# Patient Record
Sex: Male | Born: 1960 | Race: Black or African American | Hispanic: No | Marital: Married | Smoking: Former smoker
Health system: Southern US, Community
[De-identification: ages and names within clinical notes are randomized; demographics above are authoritative.]

## PROBLEM LIST (undated history)

## (undated) DIAGNOSIS — H5440 Blindness, one eye, unspecified eye: Secondary | ICD-10-CM

## (undated) HISTORY — DX: Blindness, one eye, unspecified eye: H54.40

## (undated) HISTORY — PX: EYE SURGERY: SHX253

## (undated) HISTORY — PX: COLONOSCOPY: SHX174

---

## 1999-01-04 ENCOUNTER — Encounter: Admission: RE | Admit: 1999-01-04 | Discharge: 1999-01-18 | Payer: Self-pay | Admitting: Specialist

## 2012-02-24 ENCOUNTER — Encounter: Payer: Self-pay | Admitting: Internal Medicine

## 2012-03-23 ENCOUNTER — Telehealth: Payer: Self-pay | Admitting: *Deleted

## 2012-03-23 NOTE — Telephone Encounter (Signed)
Home number out of service.  Sent No show letter

## 2012-04-02 ENCOUNTER — Encounter: Payer: Self-pay | Admitting: Internal Medicine

## 2015-01-27 ENCOUNTER — Encounter: Payer: Self-pay | Admitting: Pediatrics

## 2015-01-27 ENCOUNTER — Ambulatory Visit (INDEPENDENT_AMBULATORY_CARE_PROVIDER_SITE_OTHER): Payer: BLUE CROSS/BLUE SHIELD | Admitting: Pediatrics

## 2015-01-27 VITALS — BP 117/70 | HR 86 | Temp 98.0°F | Ht 72.0 in | Wt 215.6 lb

## 2015-01-27 DIAGNOSIS — Z0000 Encounter for general adult medical examination without abnormal findings: Secondary | ICD-10-CM | POA: Diagnosis not present

## 2015-01-27 DIAGNOSIS — Z6829 Body mass index (BMI) 29.0-29.9, adult: Secondary | ICD-10-CM

## 2015-01-27 DIAGNOSIS — Z1211 Encounter for screening for malignant neoplasm of colon: Secondary | ICD-10-CM

## 2015-01-27 DIAGNOSIS — F17200 Nicotine dependence, unspecified, uncomplicated: Secondary | ICD-10-CM | POA: Diagnosis not present

## 2015-01-27 MED ORDER — VARENICLINE TARTRATE 0.5 MG X 11 & 1 MG X 42 PO MISC: ORAL | Status: DC

## 2015-01-27 NOTE — Progress Notes (Signed)
Subjective:    Patient ID: [name], male    DOB: 1961/01/10, 54 y.o.   MRN: [id number]  CC: CPE  HPI: [name] is a 54 y.o. male presenting on 01/27/2015 for New Patient (Initial Visit)  Overall feeling well, here for yearly physical for work. Works at [employer], does a lot of lifting throughout the day. No problems with joints.  Smoker 1ppd interested in quitting. Has tried decreasing cigarettes. Smokes at work. Has gone hours without cigarettes such as when driving long distances in his new car, or when with his mom. Lives with his GF, she does not smoke. He usually but not always smokes outside.  No CP with exertion, no SOB, normal stooling, normal appetite Has gained a few pounds in last few years, prior baseline was 195, now 215 lbs   ROS: All systems negative other than what is in HPI  Past Medical History There are no active problems to display for this patient.  Social History   Social History  . Marital Status: Single    Spouse Name: N/A  . Number of Children: N/A  . Years of Education: N/A   Occupational History  . Not on file.   Social History Main Topics  . Smoking status: Current Every Day Smoker -- 1.00 packs/day    Types: Cigarettes  . Smokeless tobacco: Not on file  . Alcohol Use: No  . Drug Use: No  . Sexual Activity: No   Other Topics Concern  . Not on file   Social History Narrative  . No narrative on file   Fam hx: sister with DM2 No fam hx of colon ca  Current Outpatient Prescriptions  Medication Sig Dispense Refill  . varenicline (CHANTIX PAK) 0.5 MG X 11 & 1 MG X 42 tablet Take one 0.5 mg tablet by mouth once daily for 3 days, then increase to one 0.5 mg tablet twice daily for 4 days, then increase to one 1 mg tablet twice daily. 53 tablet 0   No current facility-administered medications for this visit.       Objective:    BP 117/70 mmHg  Pulse 86  Temp(Src) 98 F (36.7 C) (Oral)  Ht 6' (1.829 m)  Wt 215 lb  9.6 oz (97.796 kg)  BMI 29.23 kg/m2  Wt Readings from Last 3 Encounters:  01/27/15 215 lb 9.6 oz (97.796 kg)    Gen: NAD, alert, cooperative with exam, NCAT EYES: EOMI, no scleral injection or icterus ENT:  TMs pearly gray b/l, OP without erythema LYMPH: no cervical LAD CV: NRRR, normal S1/S2, no murmur, distal pulses 2+ b/l Resp: CTABL, no wheezes, normal WOB Abd: +BS, soft, NTND. no guarding or organomegaly Ext: No edema, warm Neuro: Alert and oriented, strength equal b/l UE and LE, coordination grossly normal MSK: normal muscle bulk     Assessment & Plan:   Joangel was seen today for CPE.  Diagnoses and all orders for this visit:  Encounter for preventive health examination -     Ambulatory referral to Gastroenterology -     CMP14+EGFR -     Lipid panel  Tobacco use disorder Over 10 minutes spent in counseling on tobacco cessation strategies, including discussed identifying places and routines that he smokes during, coming up with different plan that is not smoking during those times. He is planning on using Jan 1 as his quit date, will start chantix 2 weeks before, let me know how he is doing, will send in refill.  Come back to see me 8 weeks after starting chantix. Will let me know if has any side effects from chantix.  -     varenicline (CHANTIX PAK) 0.5 MG X 11 & 1 MG X 42 tablet; Take one 0.5 mg tablet by mouth once daily for 3 days, then increase to one 0.5 mg tablet twice daily for 4 days, then increase to one 1 mg tablet twice daily.  Screening for colon cancer Due for colonoscopy, none prior. -     Ambulatory referral to Gastroenterology  BMI 29 Discussed lifestyle changes, increasing exercise daily with walking 20-30 min, avoiding fast food, not ordering french fries when he does get fast food, try not to eat out.  Follow up plan: Return in about 8 weeks (around 03/24/2015).  Assunta Found, MD [facility] 01/27/2015, 3:36 PM

## 2015-01-28 LAB — CMP14+EGFR
A/G RATIO: 2.2 (ref 1.1–2.5)
ALBUMIN: 4.7 g/dL (ref 3.5–5.5)
ALT: 25 IU/L (ref 0–44)
AST: 20 IU/L (ref 0–40)
Alkaline Phosphatase: 74 IU/L (ref 39–117)
BILIRUBIN TOTAL: 0.3 mg/dL (ref 0.0–1.2)
BUN / CREAT RATIO: 11 (ref 9–20)
BUN: 12 mg/dL (ref 6–24)
CALCIUM: 10.1 mg/dL (ref 8.7–10.2)
CHLORIDE: 104 mmol/L (ref 97–106)
CO2: 24 mmol/L (ref 18–29)
Creatinine, Ser: 1.08 mg/dL (ref 0.76–1.27)
GFR, EST AFRICAN AMERICAN: 89 mL/min/{1.73_m2} (ref >59)
GFR, EST NON AFRICAN AMERICAN: 77 mL/min/{1.73_m2} (ref >59)
Globulin, Total: 2.1 g/dL (ref 1.5–4.5)
Glucose: 85 mg/dL (ref 65–99)
POTASSIUM: 4.9 mmol/L (ref 3.5–5.2)
Sodium: 143 mmol/L (ref 136–144)
TOTAL PROTEIN: 6.8 g/dL (ref 6.0–8.5)

## 2015-01-28 LAB — LIPID PANEL
CHOL/HDL RATIO: 4.3 ratio (ref 0.0–5.0)
Cholesterol, Total: 213 mg/dL — ABNORMAL HIGH (ref 100–199)
HDL: 49 mg/dL (ref >39)
LDL Calculated: 144 mg/dL — ABNORMAL HIGH (ref 0–99)
Triglycerides: 102 mg/dL (ref 0–149)
VLDL CHOLESTEROL CAL: 20 mg/dL (ref 5–40)

## 2015-11-25 ENCOUNTER — Emergency Department (HOSPITAL_COMMUNITY): Payer: BLUE CROSS/BLUE SHIELD

## 2015-11-25 ENCOUNTER — Emergency Department (HOSPITAL_COMMUNITY)
Admission: EM | Admit: 2015-11-25 | Discharge: 2015-11-25 | Disposition: A | Discharge status: Home / Self Care | Payer: BLUE CROSS/BLUE SHIELD | Attending: Emergency Medicine | Admitting: Emergency Medicine

## 2015-11-25 ENCOUNTER — Encounter (HOSPITAL_COMMUNITY): Payer: Self-pay | Admitting: Emergency Medicine

## 2015-11-25 DIAGNOSIS — Z79899 Other long term (current) drug therapy: Secondary | ICD-10-CM | POA: Insufficient documentation

## 2015-11-25 DIAGNOSIS — R0981 Nasal congestion: Secondary | ICD-10-CM | POA: Diagnosis not present

## 2015-11-25 DIAGNOSIS — R42 Dizziness and giddiness: Secondary | ICD-10-CM | POA: Diagnosis not present

## 2015-11-25 DIAGNOSIS — F17210 Nicotine dependence, cigarettes, uncomplicated: Secondary | ICD-10-CM | POA: Insufficient documentation

## 2015-11-25 LAB — CBC WITH DIFFERENTIAL/PLATELET
Basophils Absolute: 0 10*3/uL (ref 0.0–0.1)
Basophils Relative: 0 %
EOS ABS: 0.1 10*3/uL (ref 0.0–0.7)
Eosinophils Relative: 2 %
HEMATOCRIT: 44.1 % (ref 39.0–52.0)
HEMOGLOBIN: 15.2 g/dL (ref 13.0–17.0)
LYMPHS ABS: 2.4 10*3/uL (ref 0.7–4.0)
LYMPHS PCT: 42 %
MCH: 29.9 pg (ref 26.0–34.0)
MCHC: 34.5 g/dL (ref 30.0–36.0)
MCV: 86.8 fL (ref 78.0–100.0)
Monocytes Absolute: 0.5 10*3/uL (ref 0.1–1.0)
Monocytes Relative: 10 %
NEUTROS ABS: 2.6 10*3/uL (ref 1.7–7.7)
NEUTROS PCT: 46 %
Platelets: 245 10*3/uL (ref 150–400)
RBC: 5.08 MIL/uL (ref 4.22–5.81)
RDW: 13.3 % (ref 11.5–15.5)
WBC: 5.7 10*3/uL (ref 4.0–10.5)

## 2015-11-25 LAB — BASIC METABOLIC PANEL
ANION GAP: 9 (ref 5–15)
BUN: 16 mg/dL (ref 6–20)
CHLORIDE: 104 mmol/L (ref 101–111)
CO2: 26 mmol/L (ref 22–32)
CREATININE: 1.1 mg/dL (ref 0.61–1.24)
Calcium: 9.7 mg/dL (ref 8.9–10.3)
GFR calc non Af Amer: >60 mL/min (ref >60)
Glucose, Bld: 90 mg/dL (ref 65–99)
POTASSIUM: 4.3 mmol/L (ref 3.5–5.1)
SODIUM: 139 mmol/L (ref 135–145)

## 2015-11-25 MED ORDER — MECLIZINE HCL 12.5 MG PO TABS
25.0000 mg | ORAL_TABLET | Freq: Once | ORAL | Status: AC
  Administered 2015-11-25: 25 mg via ORAL
  Filled 2015-11-25: qty 2

## 2015-11-25 MED ORDER — SODIUM CHLORIDE 0.9 % IV BOLUS (SEPSIS)
1000.0000 mL | Freq: Once | INTRAVENOUS | Status: AC
  Administered 2015-11-25: 1000 mL via INTRAVENOUS

## 2015-11-25 MED ORDER — DIAZEPAM 5 MG/ML IJ SOLN
5.0000 mg | Freq: Once | INTRAMUSCULAR | Status: AC
  Administered 2015-11-25: 5 mg via INTRAVENOUS
  Filled 2015-11-25: qty 2

## 2015-11-25 MED ORDER — MECLIZINE HCL 25 MG PO TABS: 25.0000 mg | ORAL_TABLET | Freq: Three times a day (TID) | ORAL | 0 refills | Status: DC | PRN

## 2015-11-25 NOTE — Discharge Instructions (Signed)
Meclizine as prescribed as needed for dizziness. ° °Return to the emergency department if your symptoms significantly worsen or change. °

## 2015-11-25 NOTE — ED Provider Notes (Addendum)
[facility]-EMERGENCY DEPT Provider Note   CSN: [id number] Arrival date & time: 11/25/15  1055  By signing my name below, I, Rosario AdieWilliam Andrew Hiatt, attest that this documentation has been prepared under the direction and in the presence of . Electronically Signed: Rosario AdieWilliam Andrew Hiatt, ED Scribe. 11/25/15. 12:33 PM.  History   Chief Complaint Chief Complaint  Patient presents with  . Dizziness   The history is provided by the patient. No language interpreter was used.  Dizziness  Quality:  Vertigo and imbalance Severity:  Moderate Timing:  Intermittent Progression:  Waxing and waning Chronicity:  New Context: head movement and standing up   Relieved by:  Being still Worsened by:  Movement Ineffective treatments:  None tried Associated symptoms: no headaches, no hearing loss, no nausea, no palpitations, no vision changes, no vomiting and no weakness   Risk factors: no anemia, no heart disease, no hx of stroke, no hx of vertigo, no Meniere's disease, no multiple medications and no new medications    HPI Comments: [name] is a 55 y.o. male with no pertinent PMHx, who presents to the Emergency Department complaining of intermittent episodes of dizziness onset this AM.  Pt reports that he woke up this morning with his dizziness. He states that he additionally feels off balance secondary to his dizziness and a sensation of fullness to his left ear. No hx of similar symptoms. Pt notes that he has been sick recently with mild congestion, but no illnesses otherwise. His dizziness is mildly relieved with sitting still, and exacerbated with movement and positional changes. Denies weakness, numbness, HA, hearing loss, visual disturbance, nausea, emesis, sensation of palpitations, or any other associated symptoms.  History reviewed. No pertinent past medical history.  Patient Active Problem List   Diagnosis Date Noted  . Tobacco use disorder 01/27/2015  . BMI 29.0-29.9,adult 01/27/2015    Past Surgical History:  Procedure Laterality Date  . EYE SURGERY      Home Medications    Prior to Admission medications   Medication Sig Start Date End Date Taking? Authorizing Provider  varenicline (CHANTIX PAK) 0.5 MG X 11 & 1 MG X 42 tablet Take one 0.5 mg tablet by mouth once daily for 3 days, then increase to one 0.5 mg tablet twice daily for 4 days, then increase to one 1 mg tablet twice daily. 01/27/15   Johna Sheriffarol L Vincent, MD    Family History Family History  Problem Relation Age of Onset  . Diabetes Sister     Social History Social History  Substance Use Topics  . Smoking status: Current Every Day Smoker    Packs/day: 1.00    Types: Cigarettes  . Smokeless tobacco: Never Used  . Alcohol use No     Comment: occassional   Allergies   Review of patient's allergies indicates no known allergies.  Review of Systems Review of Systems  HENT: Positive for congestion. Negative for hearing loss.   Cardiovascular: Negative for palpitations.  Gastrointestinal: Negative for nausea and vomiting.  Neurological: Positive for dizziness. Negative for weakness and headaches.  All other systems reviewed and are negative.  Physical Exam Updated Vital Signs BP 140/81 (BP Location: Left Arm)   Pulse 66   Temp 97.6 F (36.4 C) (Oral)   Resp 18   Ht 6' (1.829 m)   Wt 224 lb (101.6 kg)   SpO2 100%   BMI 30.38 kg/m   Physical Exam  Constitutional: He is oriented to person, place, and time. He appears well-developed  and well-nourished.  HENT:  Head: Normocephalic and atraumatic.  Eyes: EOM are normal. Pupils are equal, round, and reactive to light.  Neck: Normal range of motion.  Cardiovascular: Normal rate, regular rhythm, normal heart sounds and intact distal pulses.   Pulmonary/Chest: Effort normal and breath sounds normal. No respiratory distress.  Abdominal: Soft. He exhibits no distension. There is no tenderness.  Musculoskeletal: Normal range of motion.   Neurological: He is alert and oriented to person, place, and time. No cranial nerve deficit. He exhibits normal muscle tone. Coordination normal.  Skin: Skin is warm and dry.  Psychiatric: He has a normal mood and affect. Judgment normal.  Nursing note and vitals reviewed.  ED Treatments / Results  DIAGNOSTIC STUDIES: Oxygen Saturation is 100% on RA, normal by my interpretation.   COORDINATION OF CARE: 12:03 PM-Discussed next steps with pt. Pt verbalized understanding and is agreeable with the plan.   Labs (all labs ordered are listed, but only abnormal results are displayed) Labs Reviewed - No data to display  EKG  EKG Interpretation  Date/Time:  Saturday November 25 2015 12:20:19 EDT Ventricular Rate:  63 PR Interval:    QRS Duration: 95 QT Interval:  423 QTC Calculation: 433 R Axis:   70 Text Interpretation:  Sinus rhythm Baseline wander in lead(s) V6 Confirmed by Sarajean Dessert  MD, Harlow Basley ([id number]) on 11/25/2015 12:34:08 PM       Radiology No results found.  Procedures Procedures (including critical care time)  Medications Ordered in ED Medications - No data to display   Initial Impression / Assessment and Plan / ED Course  I have reviewed the triage vital signs and the nursing notes.  Pertinent labs & imaging results that were available during my care of the patient were reviewed by me and considered in my medical decision making (see chart for details).  Clinical Course    Patient presents with complaints of dizziness that he describes as a spinning sensation and is worse with movement and turning head. His neurologic exam is nonfocal. I highly suspect a peripheral vertigo. He was given meclizine and appears to be feeling better. I see no indication for imaging and believe he is appropriate for discharge. Laboratory studies and EKG are normal.  Patient was to be discharged, however became acutely dizzy once again when ambulating to the bathroom. He expressed some  concern about going home. He was then given IV fluids, Valium, and underwent head CT which was negative.  His symptoms and workup continue to be consistent with a peripheral vertigo and he will be treated as such.  Final Clinical Impressions(s) / ED Diagnoses   Final diagnoses:  None    New Prescriptions New Prescriptions   No medications on file   I personally performed the services described in this documentation, which was scribed in my presence. The recorded information has been reviewed and is accurate.       Geoffery Lyons, MD 11/25/15 1334    Geoffery Lyons, MD 11/25/15 [phone]

## 2015-11-25 NOTE — ED Triage Notes (Signed)
PT stated he went to bed around 1030 pm last night feeling fine and woke up at 0530 this am and upon standing feels dizzy but denies dizziness while sitting. PT also states his ears feel stopped up. PT denies any weakness.

## 2016-02-09 ENCOUNTER — Encounter: Payer: Self-pay | Admitting: Pediatrics

## 2016-02-09 ENCOUNTER — Ambulatory Visit (INDEPENDENT_AMBULATORY_CARE_PROVIDER_SITE_OTHER): Payer: BLUE CROSS/BLUE SHIELD | Admitting: Pediatrics

## 2016-02-09 VITALS — BP 125/81 | HR 87 | Temp 97.0°F | Ht 72.0 in | Wt 224.6 lb

## 2016-02-09 DIAGNOSIS — Z720 Tobacco use: Secondary | ICD-10-CM

## 2016-02-09 DIAGNOSIS — Z6830 Body mass index (BMI) 30.0-30.9, adult: Secondary | ICD-10-CM

## 2016-02-09 DIAGNOSIS — Z0000 Encounter for general adult medical examination without abnormal findings: Secondary | ICD-10-CM | POA: Diagnosis not present

## 2016-02-09 NOTE — Progress Notes (Signed)
  Subjective:   Patient ID: [name], male    DOB: [birth year], 55 y.o.   MRN: [id number] CC: Annual Exam  HPI: [name] is a 55 y.o. male presenting for Annual Exam  Feeling well Here for preventive exam for work No complaints  [place] colonoscopy: within last couple of years  No recent fevers, weight changes  Daily smoker Interested in quitting Down to 7-8 cig a day now Working on decreasing  Trying to stay active, walks a couple times a week  Fam hx: no MI  Relevant past medical, surgical, family and social history reviewed. Allergies and medications reviewed and updated. History  Smoking Status  . Current Every Day Smoker  . Packs/day: 1.00  . Types: Cigarettes  Smokeless Tobacco  . Never Used   ROS: Per HPI   Objective:    BP 125/81   Pulse 87   Temp 97 F (36.1 C) (Oral)   Ht 6' (1.829 m)   Wt 224 lb 9.6 oz (101.9 kg)   BMI 30.46 kg/m   Wt Readings from Last 3 Encounters:  02/09/16 224 lb 9.6 oz (101.9 kg)  11/25/15 224 lb (101.6 kg)  01/27/15 215 lb 9.6 oz (97.8 kg)    Gen: NAD, alert, cooperative with exam, NCAT EYES: EOMI, no conjunctival injection, or no icterus ENT:  TMs pearly gray b/l, OP without erythema LYMPH: no cervical LAD CV: NRRR, normal S1/S2, no murmur, distal pulses 2+ b/l Resp: CTABL, no wheezes, normal WOB Abd: +BS, soft, NTND. no guarding or organomegaly Ext: No edema, warm Neuro: Alert and oriented, strength equal b/l UE and LE, coordination grossly normal MSK: normal muscle bulk  Assessment & Plan:  [name] was seen today for annual exam.  Diagnoses and all orders for this visit:  Encounter for preventive health examination Colonoscopy: UTD, done at [facility] -     Lipid panel  BMI 30.0-30.9,adult Cont lifestyle changes, increase activity  Tobacco use Cont to decrease cig/day Down to 7-8 Discussed smoking cessation strategies  Follow up plan: Return in about 1 year  (around 02/08/2017). Assunta Found, MD [place]

## 2016-02-10 LAB — CMP14+EGFR
ALK PHOS: 79 IU/L (ref 39–117)
ALT: 24 IU/L (ref 0–44)
AST: 18 IU/L (ref 0–40)
Albumin/Globulin Ratio: 2.2 (ref 1.2–2.2)
Albumin: 4.6 g/dL (ref 3.5–5.5)
BUN/Creatinine Ratio: 12 (ref 9–20)
BUN: 15 mg/dL (ref 6–24)
Bilirubin Total: 0.3 mg/dL (ref 0.0–1.2)
CALCIUM: 9.7 mg/dL (ref 8.7–10.2)
CO2: 23 mmol/L (ref 18–29)
CREATININE: 1.26 mg/dL (ref 0.76–1.27)
Chloride: 103 mmol/L (ref 96–106)
GFR calc Af Amer: 74 mL/min/{1.73_m2} (ref >59)
GFR, EST NON AFRICAN AMERICAN: 64 mL/min/{1.73_m2} (ref >59)
GLUCOSE: 89 mg/dL (ref 65–99)
Globulin, Total: 2.1 g/dL (ref 1.5–4.5)
Potassium: 4.9 mmol/L (ref 3.5–5.2)
Sodium: 141 mmol/L (ref 134–144)
Total Protein: 6.7 g/dL (ref 6.0–8.5)

## 2016-02-10 LAB — LIPID PANEL
CHOLESTEROL TOTAL: 224 mg/dL — AB (ref 100–199)
Chol/HDL Ratio: 5.2 ratio units — ABNORMAL HIGH (ref 0.0–5.0)
HDL: 43 mg/dL (ref >39)
LDL CALC: 149 mg/dL — AB (ref 0–99)
TRIGLYCERIDES: 160 mg/dL — AB (ref 0–149)
VLDL CHOLESTEROL CAL: 32 mg/dL (ref 5–40)

## 2016-05-08 ENCOUNTER — Encounter: Payer: Self-pay | Admitting: Family Medicine

## 2016-05-08 ENCOUNTER — Ambulatory Visit (INDEPENDENT_AMBULATORY_CARE_PROVIDER_SITE_OTHER): Payer: BLUE CROSS/BLUE SHIELD

## 2016-05-08 ENCOUNTER — Ambulatory Visit (INDEPENDENT_AMBULATORY_CARE_PROVIDER_SITE_OTHER): Payer: BLUE CROSS/BLUE SHIELD | Admitting: Family Medicine

## 2016-05-08 VITALS — BP 128/87 | HR 84 | Temp 97.2°F | Ht 73.83 in | Wt 224.0 lb

## 2016-05-08 DIAGNOSIS — M545 Low back pain, unspecified: Secondary | ICD-10-CM

## 2016-05-08 MED ORDER — TRAMADOL HCL 50 MG PO TABS: 50.0000 mg | ORAL_TABLET | Freq: Three times a day (TID) | ORAL | 0 refills | Status: DC | PRN

## 2016-05-08 MED ORDER — PREDNISONE 20 MG PO TABS: ORAL_TABLET | ORAL | 0 refills | Status: DC

## 2016-05-08 MED ORDER — CYCLOBENZAPRINE HCL 10 MG PO TABS: 10.0000 mg | ORAL_TABLET | Freq: Three times a day (TID) | ORAL | 0 refills | Status: DC | PRN

## 2016-05-08 NOTE — Patient Instructions (Signed)
Great to meet you!  Take prednisone daily, use flexeril and tramadol only as needed.   If this becomes recurrent then we can get physical therapy to help reduce the recurrence.   We will call within 1 week with x ray results

## 2016-05-08 NOTE — Progress Notes (Signed)
   HPI  Patient presents today here with back pain.  Patient explains that this weekend, Saturday, he was bending over to pick up something in his car when he had acute sudden midline low back pain. He states that he continues to hurt since that time described as midline dull low back pain with no radiation down the leg. It feels better after walking a few steps that hurts with sitting down for prolonged time. It's not causing any difficulty with sleep.  Patient states this has not happened in 5 or more years. He denies leg pain. He denies any difficulty walking  PMH: Smoking status noted ROS: Per HPI  Objective: BP 128/87   Pulse 84   Temp 97.2 F (36.2 C) (Oral)   Ht 6' 1.83" (1.875 m)   Wt 224 lb (101.6 kg)   BMI 28.89 kg/m  Gen: NAD, alert, cooperative with exam HEENT: NCAT, EOMI, PERRL CV: RRR, good S1/S2, no murmur Resp: CTABL, no wheezes, non-labored Neuro: Alert and oriented, strength 5/5 and sensation intact in bilateral lower extremities MSK Tenderness to palpation of the lower lumbar spine and midline area, also mild tenderness to palpation of the paraspinal muscles bilaterally  Assessment and plan:  # Acute midline low back pain without sciatica Treat with prednisone plus when necessary Flexeril and tramadol Plain film with midline tenderness If recurrent would recommend physical therapy. Return to clinic with any concerns or worsening symptoms.     Orders Placed This Encounter  Procedures  . DG Lumbar Spine 2-3 Views    Standing Status:   Future    Number of Occurrences:   1    Standing Expiration Date:   07/08/2017    Order Specific Question:   Reason for Exam (SYMPTOM  OR DIAGNOSIS REQUIRED)    Answer:   low back pain    Order Specific Question:   Preferred imaging location?    Answer:   Internal    Meds ordered this encounter  Medications  . predniSONE (DELTASONE) 20 MG tablet    Sig: 2 po at same time daily for 5 days    Dispense:  10 tablet    Refill:  0  . cyclobenzaprine (FLEXERIL) 10 MG tablet    Sig: Take 1 tablet (10 mg total) by mouth 3 (three) times daily as needed for muscle spasms.    Dispense:  30 tablet    Refill:  0  . traMADol (ULTRAM) 50 MG tablet    Sig: Take 1 tablet (50 mg total) by mouth every 8 (eight) hours as needed.    Dispense:  10 tablet    Refill:  0    Murtis SinkSam Bradshaw, MD [facility] 05/08/2016, 2:18 PM

## 2016-06-25 ENCOUNTER — Encounter: Payer: BLUE CROSS/BLUE SHIELD | Admitting: Family

## 2016-08-24 ENCOUNTER — Ambulatory Visit (INDEPENDENT_AMBULATORY_CARE_PROVIDER_SITE_OTHER): Payer: BLUE CROSS/BLUE SHIELD | Admitting: Family Medicine

## 2016-08-24 ENCOUNTER — Encounter: Payer: Self-pay | Admitting: Family Medicine

## 2016-08-24 VITALS — BP 117/71 | HR 75 | Temp 96.9°F | Ht 73.83 in | Wt 225.0 lb

## 2016-08-24 DIAGNOSIS — R0602 Shortness of breath: Secondary | ICD-10-CM | POA: Diagnosis not present

## 2016-08-24 DIAGNOSIS — J40 Bronchitis, not specified as acute or chronic: Secondary | ICD-10-CM

## 2016-08-24 DIAGNOSIS — J329 Chronic sinusitis, unspecified: Secondary | ICD-10-CM

## 2016-08-24 MED ORDER — BETAMETHASONE SOD PHOS & ACET 6 (3-3) MG/ML IJ SUSP
6.0000 mg | Freq: Once | INTRAMUSCULAR | Status: AC
  Administered 2016-08-24: 6 mg via INTRAMUSCULAR

## 2016-08-24 MED ORDER — LEVOFLOXACIN 500 MG PO TABS: 500.0000 mg | ORAL_TABLET | Freq: Every day | ORAL | 0 refills | Status: DC

## 2016-08-24 MED ORDER — HYDROCODONE-HOMATROPINE 5-1.5 MG/5ML PO SYRP: 5.0000 mL | ORAL_SOLUTION | Freq: Four times a day (QID) | ORAL | 0 refills | Status: DC | PRN

## 2016-08-24 NOTE — Progress Notes (Signed)
Chief Complaint  Patient presents with  . Shortness of Breath  . Cough  . Nasal Congestion    yellow  . mid back pain    HPI  Patient presents today for 2 days of increasing cough and shortness of breath. He just feels like his "four-barrel carburator" won't kick in. He usually works at [employer]. After 12 hours yesterday where he went home and mode until dark. Overnight symptoms continued to get worse. PMH: Smoking status noted ROS: Per HPI  Objective: BP 117/71 (BP Location: Left Arm)   Pulse 75   Temp (!) 96.9 F (36.1 C) (Oral)   Ht 6' 1.83" (1.875 m)   Wt 225 lb (102.1 kg)   BMI 29.02 kg/m  Gen: NAD, alert, cooperative with exam HEENT: NCAT, EOMI, PERRL CV: RRR, good S1/S2, no murmur Resp: CTABL, no wheezes, non-labored Abd: SNTND, BS present, no guarding or organomegaly Ext: No edema, warm Neuro: Alert and oriented, No gross deficits  Assessment and plan:  1. Sinobronchitis   2. Shortness of breath     Meds ordered this encounter  Medications  . HYDROcodone-homatropine (HYCODAN) 5-1.5 MG/5ML syrup    Sig: Take 5 mLs by mouth every 6 (six) hours as needed for cough.    Dispense:  120 mL    Refill:  0  . levofloxacin (LEVAQUIN) 500 MG tablet    Sig: Take 1 tablet (500 mg total) by mouth daily. For 10 days    Dispense:  10 tablet    Refill:  0  . betamethasone acetate-betamethasone sodium phosphate (CELESTONE) injection 6 mg    Orders Placed This Encounter  Procedures  . CBC with Differential/Platelet  . CMP14+EGFR    Order Specific Question:   Has the patient fasted?    Answer:   Yes    Follow up as needed.  Claretta Fraise, MD

## 2016-08-25 LAB — CBC WITH DIFFERENTIAL/PLATELET
Basophils Absolute: 0 10*3/uL (ref 0.0–0.2)
Basos: 0 %
EOS (ABSOLUTE): 0.1 10*3/uL (ref 0.0–0.4)
EOS: 1 %
HEMATOCRIT: 45.1 % (ref 37.5–51.0)
HEMOGLOBIN: 15.6 g/dL (ref 13.0–17.7)
IMMATURE GRANS (ABS): 0 10*3/uL (ref 0.0–0.1)
IMMATURE GRANULOCYTES: 0 %
LYMPHS: 49 %
Lymphocytes Absolute: 2.5 10*3/uL (ref 0.7–3.1)
MCH: 30 pg (ref 26.6–33.0)
MCHC: 34.6 g/dL (ref 31.5–35.7)
MCV: 87 fL (ref 79–97)
MONOCYTES: 7 %
MONOS ABS: 0.4 10*3/uL (ref 0.1–0.9)
NEUTROS PCT: 43 %
Neutrophils Absolute: 2.3 10*3/uL (ref 1.4–7.0)
Platelets: 272 10*3/uL (ref 150–379)
RBC: 5.2 x10E6/uL (ref 4.14–5.80)
RDW: 14.5 % (ref 12.3–15.4)
WBC: 5.2 10*3/uL (ref 3.4–10.8)

## 2016-08-25 LAB — CMP14+EGFR
ALT: 27 IU/L (ref 0–44)
AST: 23 IU/L (ref 0–40)
Albumin/Globulin Ratio: 2 (ref 1.2–2.2)
Albumin: 4.7 g/dL (ref 3.5–5.5)
Alkaline Phosphatase: 80 IU/L (ref 39–117)
BUN/Creatinine Ratio: 9 (ref 9–20)
BUN: 11 mg/dL (ref 6–24)
Bilirubin Total: 0.3 mg/dL (ref 0.0–1.2)
CALCIUM: 9.8 mg/dL (ref 8.7–10.2)
CO2: 22 mmol/L (ref 20–29)
CREATININE: 1.24 mg/dL (ref 0.76–1.27)
Chloride: 105 mmol/L (ref 96–106)
GFR calc Af Amer: 75 mL/min/{1.73_m2} (ref >59)
GFR, EST NON AFRICAN AMERICAN: 65 mL/min/{1.73_m2} (ref >59)
GLOBULIN, TOTAL: 2.3 g/dL (ref 1.5–4.5)
Glucose: 88 mg/dL (ref 65–99)
Potassium: 4.5 mmol/L (ref 3.5–5.2)
SODIUM: 142 mmol/L (ref 134–144)
Total Protein: 7 g/dL (ref 6.0–8.5)

## 2016-12-19 ENCOUNTER — Ambulatory Visit (INDEPENDENT_AMBULATORY_CARE_PROVIDER_SITE_OTHER): Payer: BLUE CROSS/BLUE SHIELD | Admitting: Nurse Practitioner

## 2016-12-19 ENCOUNTER — Encounter: Payer: Self-pay | Admitting: Nurse Practitioner

## 2016-12-19 VITALS — BP 111/80 | HR 80 | Temp 97.3°F | Ht 73.0 in | Wt 224.0 lb

## 2016-12-19 DIAGNOSIS — R591 Generalized enlarged lymph nodes: Secondary | ICD-10-CM

## 2016-12-19 NOTE — Progress Notes (Signed)
   Subjective:    Patient ID: [name], male    DOB: May 18, 1960, 56 y.o.   MRN: [id number]  HPI Patient comes in today c/o "large vein" on chest wall. He says he just noticd it. Denies any pain.    Review of Systems  Constitutional: Negative.   Respiratory: Negative.   Cardiovascular: Negative.   Neurological: Negative.   Psychiatric/Behavioral: Negative.   All other systems reviewed and are negative.      Objective:   Physical Exam  Constitutional: He appears well-developed and well-nourished. No distress.  Cardiovascular: Normal rate and regular rhythm.   Pulmonary/Chest: Effort normal and breath sounds normal.  Tender lymphnode inner left mammory fold.  Neurological: He is alert.  Skin: Skin is warm.  Psychiatric: He has a normal mood and affect. His behavior is normal. Judgment and thought content normal.   BP 111/80   Pulse 80   Temp (!) 97.3 F (36.3 C) (Oral)   Ht  (1.854 m)   Wt 224 lb (101.6 kg)   BMI 29.55 kg/m       Assessment & Plan:   1. Lymphadenopathy    Probably reactive Watch, if not gone by next wednesday will do ultra sound- patient will call  Mary-Margaret Daphine Deutscher, FNP

## 2017-07-07 ENCOUNTER — Encounter: Payer: Self-pay | Admitting: Pediatrics

## 2017-07-07 ENCOUNTER — Ambulatory Visit (INDEPENDENT_AMBULATORY_CARE_PROVIDER_SITE_OTHER): Payer: BLUE CROSS/BLUE SHIELD | Admitting: Pediatrics

## 2017-07-07 VITALS — BP 119/77 | HR 94 | Temp 98.5°F | Ht 73.0 in | Wt 222.0 lb

## 2017-07-07 DIAGNOSIS — Z6829 Body mass index (BMI) 29.0-29.9, adult: Secondary | ICD-10-CM | POA: Diagnosis not present

## 2017-07-07 DIAGNOSIS — Z0000 Encounter for general adult medical examination without abnormal findings: Secondary | ICD-10-CM | POA: Diagnosis not present

## 2017-07-07 NOTE — Progress Notes (Signed)
  Subjective:   Patient ID: [name], male    DOB: 1960/07/14, 57 y.o.   MRN: [id number] CC: Annual exam HPI: [name] is a 57 y.o. male   Feeling well overall.  Active at work, working for [employer].  Stays active outside of work as well.  No shortness of breath or chest pain with exertion.  Eating regular meals.  Appetite is been good.  Weight has been stable.  He thinks colonoscopy was 3 years ago at [facility].  We will request records again.  No lower urinary tract symptoms.  Rarely snores.  No daytime fatigue, no morning headache.  Relevant past medical, surgical, family and social history reviewed. Allergies and medications reviewed and updated.  Smoking: Quit several months ago.  Says he still rarely has a cigarette but not every week.  ROS: Per HPI   Objective:    BP 119/77   Pulse 94   Temp 98.5 F (36.9 C) (Oral)   Ht '6\' 1"'$  (1.854 m)   Wt 222 lb (100.7 kg)   BMI 29.29 kg/m   Wt Readings from Last 3 Encounters:  07/07/17 222 lb (100.7 kg)  12/19/16 224 lb (101.6 kg)  08/24/16 225 lb (102.1 kg)    Gen: NAD, alert, cooperative with exam, NCAT EYES: EOMI, no conjunctival injection, or no icterus ENT:  TMs pearly gray b/l, OP without erythema LYMPH: no cervical LAD CV: NRRR, normal S1/S2, no murmur, distal pulses 2+ b/l Resp: CTABL, no wheezes, normal WOB Abd: +BS, soft, NTND. no guarding or organomegaly Ext: No edema, warm Neuro: Alert and oriented, strength equal b/l UE and LE, coordination grossly normal MSK: normal muscle bulk  Assessment & Plan:  57 year old male here for annual well visit  Diagnoses and all orders for this visit:  Encounter for preventive care Will return for fasting blood work -     Lipid panel -     PSA, total and free -     BMP8+EGFR  BMI 29.0-29.9,adult Continue lifestyle modifications, increase fruit and vegetable intake, continue 20 to 30 minutes daily of aerobic exercise  Follow up  plan: Return in about 1 year (around 07/08/2018). Assunta Found, MD [place]

## 2017-07-08 ENCOUNTER — Encounter: Payer: Self-pay | Admitting: Pediatrics

## 2017-08-28 ENCOUNTER — Ambulatory Visit: Payer: BLUE CROSS/BLUE SHIELD | Admitting: Family Medicine

## 2018-03-13 ENCOUNTER — Telehealth: Payer: Self-pay | Admitting: Pediatrics

## 2018-03-13 NOTE — Telephone Encounter (Signed)
Pt c/o thumb discomfort and doesn't remember hurting it. Scheduled with Dr Oswaldo Done for 1/9 at 11:15.

## 2018-03-19 ENCOUNTER — Encounter: Payer: Self-pay | Admitting: Pediatrics

## 2018-03-19 ENCOUNTER — Ambulatory Visit: Payer: Managed Care, Other (non HMO) | Admitting: Pediatrics

## 2018-03-19 ENCOUNTER — Ambulatory Visit (INDEPENDENT_AMBULATORY_CARE_PROVIDER_SITE_OTHER): Payer: Managed Care, Other (non HMO)

## 2018-03-19 VITALS — BP 132/87 | HR 82 | Temp 97.6°F | Ht 73.0 in | Wt 231.0 lb

## 2018-03-19 DIAGNOSIS — M65312 Trigger thumb, left thumb: Secondary | ICD-10-CM | POA: Diagnosis not present

## 2018-03-19 DIAGNOSIS — M79645 Pain in left finger(s): Secondary | ICD-10-CM | POA: Diagnosis not present

## 2018-03-19 NOTE — Progress Notes (Signed)
  Subjective:   Patient ID: [name], male    DOB: May 12, 1960, 58 y.o.   MRN: [id number] CC: Thumb pain (left, 1 month)  HPI: [name] is a 58 y.o. male   Several weeks of popping in front of L thumb when he flexes thumb. Sometimes pain with it. If he wraps thumb so he cant bend it, is not bothered as much. Never had this before. No known injury.  Relevant past medical, surgical, family and social history reviewed. Allergies and medications reviewed and updated. Social History   Tobacco Use  Smoking Status Current Some Day Smoker  . Packs/day: 1.00  . Types: Cigarettes  Smokeless Tobacco Never Used   ROS: Per HPI   Objective:    BP 132/87   Pulse 82   Temp 97.6 F (36.4 C) (Oral)   Ht 6\' 1"  (1.854 m)   Wt 231 lb (104.8 kg)   BMI 30.48 kg/m   Wt Readings from Last 3 Encounters:  03/19/18 231 lb (104.8 kg)  07/07/17 222 lb (100.7 kg)  12/19/16 224 lb (101.6 kg)    Gen: NAD, alert, cooperative with exam, NCAT EYES: EOMI, no conjunctival injection, or no icterus CV:  distal pulses 2+ b/l Resp:normal WOB Ext: No edema, warm MSK: palpable popping palmar surface with flexion of L thumb. No redness or swelling of thumb.   Assessment & Plan:  [name] was seen today for thumb pain.  Diagnoses and all orders for this visit:  Trigger finger of left thumb Options discussed, pt wants to proceed with steroid injection  Thumb pain, left -     DG Finger Thumb Left; Future  PROCEDURE: Trigger thumb injection: Verbal consent was obtained from the patient. Risks including infection, bleeding explained and contrasted with benefits and alternatives. Patient prepped with betadine. L thumb flexor tendon injected. The patient tolerated the procedure well. No complications. Injection: 0.43mL bupivacaine and  Kenalog 40 mg. Needle: 27 gauge   Follow up plan: Return if symptoms worsen or fail to improve. Rex Kras, MD [facility]

## 2018-07-06 ENCOUNTER — Ambulatory Visit (INDEPENDENT_AMBULATORY_CARE_PROVIDER_SITE_OTHER): Payer: Managed Care, Other (non HMO) | Admitting: Family Medicine

## 2018-07-06 ENCOUNTER — Other Ambulatory Visit: Payer: Self-pay

## 2018-07-06 ENCOUNTER — Encounter: Payer: Self-pay | Admitting: Family Medicine

## 2018-07-06 ENCOUNTER — Telehealth: Payer: Self-pay | Admitting: Family Medicine

## 2018-07-06 DIAGNOSIS — M5441 Lumbago with sciatica, right side: Secondary | ICD-10-CM | POA: Diagnosis not present

## 2018-07-06 MED ORDER — PREDNISONE 20 MG PO TABS: ORAL_TABLET | ORAL | 0 refills | Status: DC

## 2018-07-06 MED ORDER — CYCLOBENZAPRINE HCL 5 MG PO TABS: 5.0000 mg | ORAL_TABLET | Freq: Three times a day (TID) | ORAL | 0 refills | Status: AC | PRN

## 2018-07-06 NOTE — Progress Notes (Signed)
Virtual Visit via telephone Note Due to COVID-19, visit is conducted virtually and was requested by patient.   I connected with [name] on 07/06/18 at 1005 by telephone and verified that I am speaking with the correct person using two identifiers. [name] is currently located at home and family is currently with them during visit. The provider, Kari Baars, FNP is located in their office at time of visit.  I discussed the limitations, risks, security and privacy concerns of performing an evaluation and management service by telephone and the availability of in person appointments. I also discussed with the patient that there may be a patient responsible charge related to this service. The patient expressed understanding and agreed to proceed.  Subjective:  Patient ID: [name], male    DOB: May 08, 1960, 58 y.o.   MRN: [id number]  Chief Complaint:  Back Pain   HPI: [name] is a 58 y.o. male presenting on 07/06/2018 for Back Pain   Pt reports bilateral lower back pain that stared on Saturday. States he was bent over and felt a pain in his back. He states the pain is bilateral and radiates down his right leg. He denied numbness, tingling, weakness, loss of function, bowel or bladder incontinence, or saddle anesthesia. No fever, chills, loss of weight, or abnormal bleeding.    Relevant past medical, surgical, family, and social history reviewed and updated as indicated.  Allergies and medications reviewed and updated.   History reviewed. No pertinent past medical history.  Past Surgical History:  Procedure Laterality Date  . EYE SURGERY      Social History   Socioeconomic History  . Marital status: Legally Separated    Spouse name: Not on file  . Number of children: Not on file  . Years of education: Not on file  . Highest education level: Not on file  Occupational History  . Not on file  Social Needs  . Financial resource strain:  Not on file  . Food insecurity:    Worry: Not on file    Inability: Not on file  . Transportation needs:    Medical: Not on file    Non-medical: Not on file  Tobacco Use  . Smoking status: Current Some Day Smoker    Packs/day: 1.00    Types: Cigarettes  . Smokeless tobacco: Never Used  Substance and Sexual Activity  . Alcohol use: No    Comment: occassional  . Drug use: No  . Sexual activity: Never    Birth control/protection: Condom  Lifestyle  . Physical activity:    Days per week: Not on file    Minutes per session: Not on file  . Stress: Not on file  Relationships  . Social connections:    Talks on phone: Not on file    Gets together: Not on file    Attends religious service: Not on file    Active member of club or organization: Not on file    Attends meetings of clubs or organizations: Not on file    Relationship status: Not on file  . Intimate partner violence:    Fear of current or ex partner: Not on file    Emotionally abused: Not on file    Physically abused: Not on file    Forced sexual activity: Not on file  Other Topics Concern  . Not on file  Social History Narrative  . Not on file    Outpatient Encounter Medications as of 07/06/2018  Medication Sig  .  cyclobenzaprine (FLEXERIL) 5 MG tablet Take 1 tablet (5 mg total) by mouth 3 (three) times daily as needed for up to 10 days for muscle spasms.  . predniSONE (DELTASONE) 20 MG tablet 2 po at sametime daily for 5 days   No facility-administered encounter medications on file as of 07/06/2018.     No Known Allergies  Review of Systems  Constitutional: Negative for activity change, appetite change, chills, diaphoresis, fatigue, fever and unexpected weight change.  Respiratory: Negative for cough and shortness of breath.   Cardiovascular: Negative for chest pain, palpitations and leg swelling.  Gastrointestinal: Negative.   Genitourinary: Negative.   Musculoskeletal: Positive for back pain. Negative for  arthralgias, gait problem, joint swelling, myalgias, neck pain and neck stiffness.  Skin: Negative for color change.  Neurological: Negative for dizziness, tremors, seizures, syncope, facial asymmetry, speech difficulty, weakness, light-headedness, numbness and headaches.  Psychiatric/Behavioral: Negative for confusion.  All other systems reviewed and are negative.        Observations/Objective: No vital signs or physical exam, this was a telephone or virtual health encounter.  Pt alert and oriented, answers all questions appropriately, and able to speak in full sentences.    Assessment and Plan: [name] was seen today for back pain.  Diagnoses and all orders for this visit:  Acute bilateral low back pain with right-sided sciatica Symptomatic care discussed. Reported symptoms consistent with bilateral lower back pain with right sciatica. No red flags present. Will treat with below. Due to absence of lab work in last year, will not treat with NSAIDs. Pt can use over the counter tylenol as needed for pain. Report any new or worsening symptoms.  -     predniSONE (DELTASONE) 20 MG tablet; 2 po at sametime daily for 5 days -     cyclobenzaprine (FLEXERIL) 5 MG tablet; Take 1 tablet (5 mg total) by mouth 3 (three) times daily as needed for up to 10 days for muscle spasms.     Follow Up Instructions: Return in about 4 weeks (around 08/03/2018), or if symptoms worsen or fail to improve, for back pain.    I discussed the assessment and treatment plan with the patient. The patient was provided an opportunity to ask questions and all were answered. The patient agreed with the plan and demonstrated an understanding of the instructions.   The patient was advised to call back or seek an in-person evaluation if the symptoms worsen or if the condition fails to improve as anticipated.  The above assessment and management plan was discussed with the patient. The patient verbalized understanding of  and has agreed to the management plan. Patient is aware to call the clinic if symptoms persist or worsen. Patient is aware when to return to the clinic for a follow-up visit. Patient educated on when it is appropriate to go to the emergency department.    I provided 15 minutes of non-face-to-face time during this encounter. The call started at 1005. The call ended at 1020.   Kari BaarsMichelle Dilia Alemany, FNP-C [facility] [address] [place], [place] [id number] [phone]

## 2018-10-05 IMAGING — DX DG LUMBAR SPINE 2-3V
2 series · 2 of 2 positions shown · non-contrast
Comparison: None.

CLINICAL DATA: Twisting injury 5 days ago with persistent pain,
initial encounter

EXAM:
LUMBAR SPINE - 2-3 VIEW

[l-spine ap]
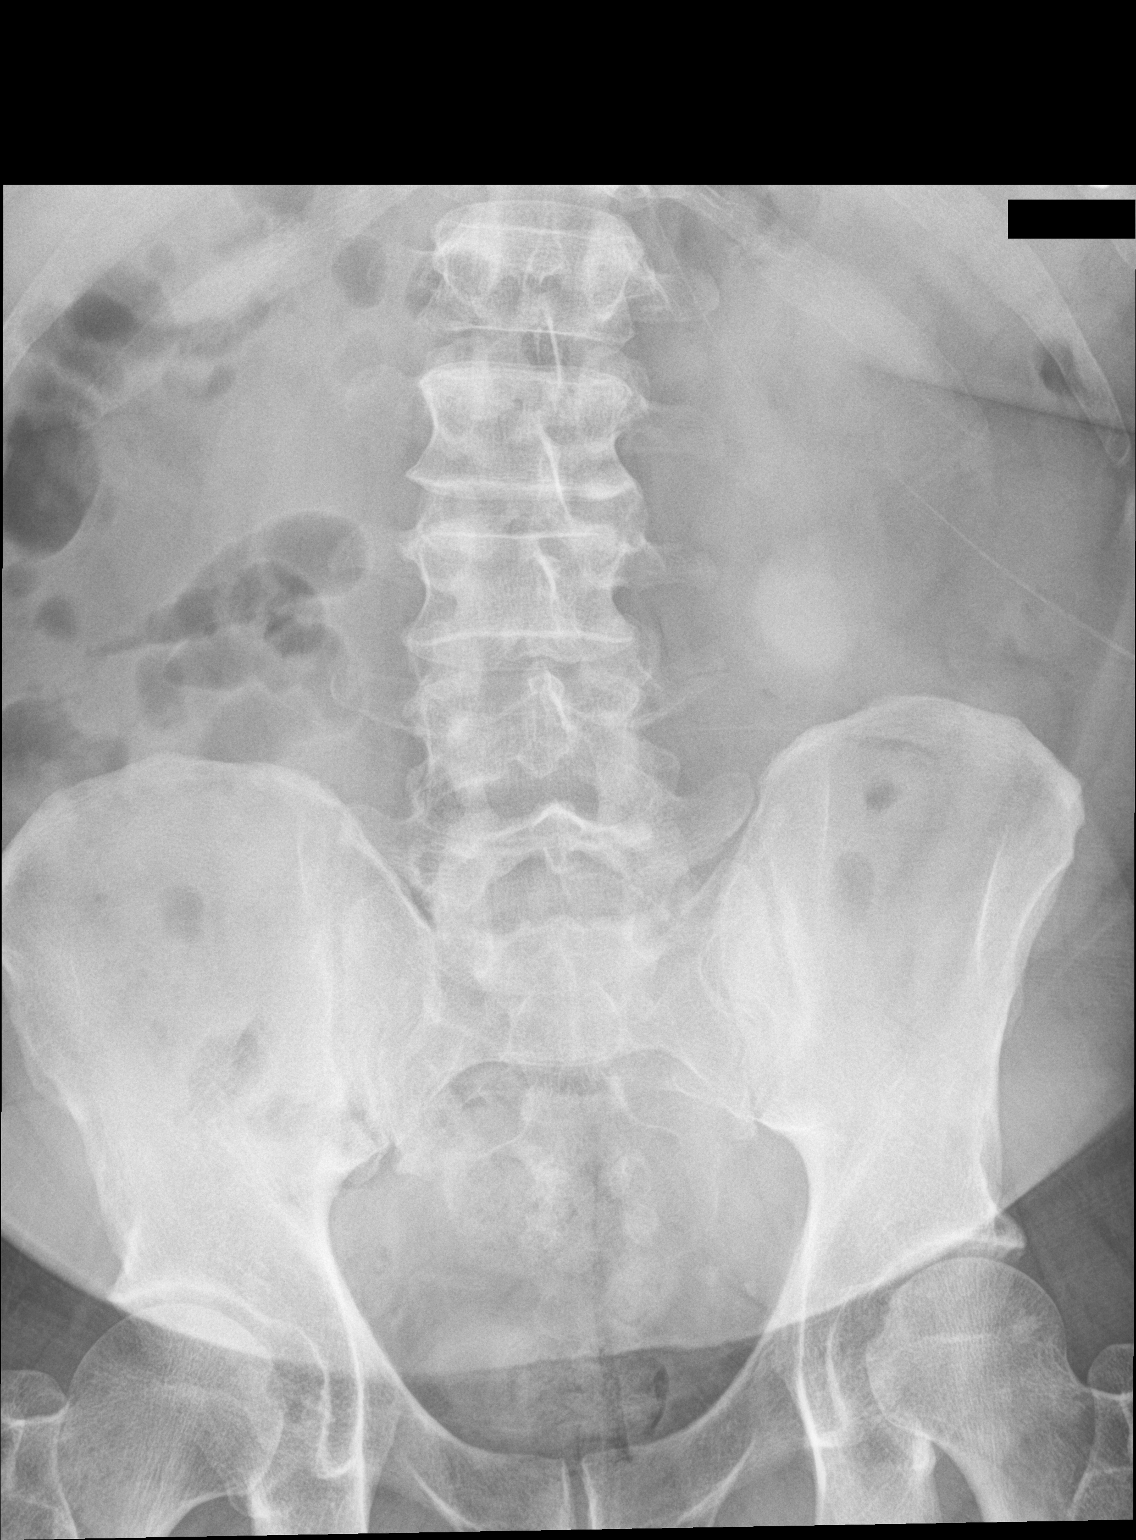

[l-spine lat]
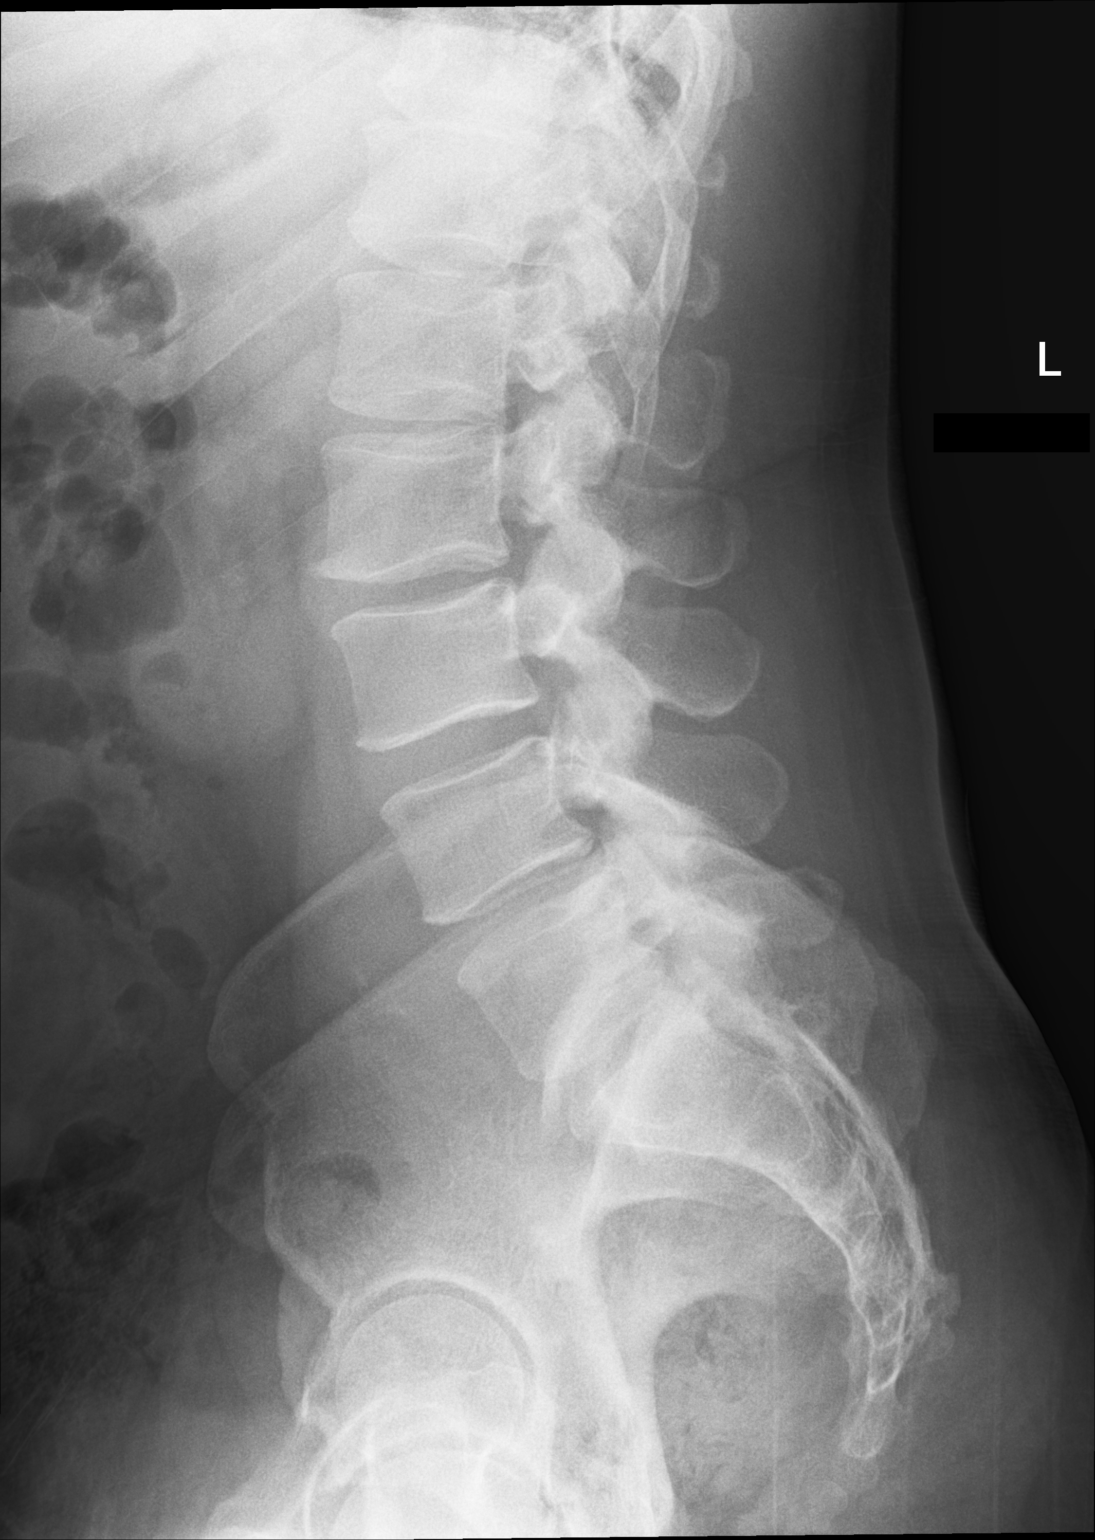

[2 of 2 positions shown; findings below may reference images not displayed]

FINDINGS: Five lumbar type vertebral bodies are well visualized. Vertebral
body height is well maintained. Mild disc space narrowing is noted
at L4-5. No soft tissue abnormality is noted.
IMPRESSION: Degenerative change in the lower lumbar spine.

## 2019-04-23 ENCOUNTER — Ambulatory Visit: Payer: Managed Care, Other (non HMO) | Admitting: Family Medicine

## 2019-05-06 ENCOUNTER — Other Ambulatory Visit: Payer: Self-pay

## 2019-05-07 ENCOUNTER — Encounter: Payer: Self-pay | Admitting: Family Medicine

## 2019-05-07 ENCOUNTER — Ambulatory Visit (INDEPENDENT_AMBULATORY_CARE_PROVIDER_SITE_OTHER): Payer: 59 | Admitting: Family Medicine

## 2019-05-07 VITALS — BP 136/84 | HR 85 | Temp 98.4°F | Resp 20 | Ht 73.0 in | Wt 236.0 lb

## 2019-05-07 DIAGNOSIS — H538 Other visual disturbances: Secondary | ICD-10-CM | POA: Diagnosis not present

## 2019-05-07 NOTE — Progress Notes (Signed)
Subjective:  Patient ID: [name], male    DOB: 1960-11-18, 59 y.o.   MRN: [id number]  Patient Care Team: Baruch Gouty, FNP as PCP - General (Family Medicine)   Chief Complaint:  Blurred Vision   HPI: [name] is a 59 y.o. male presenting on 05/07/2019 for Blurred Vision   Pt presents today for blurred vision in right eye. Pt states this started 2 weeks ago. He was seen in [facility] on 04/14/2018 for this and was prescribed VASOCIDIN eye drops. States he finished this treatment a few days ago. States he has been using warm compresses several times per day. He states today his vision is perfect. He is legally blind in left eye.     Relevant past medical, surgical, family, and social history reviewed and updated as indicated.  Allergies and medications reviewed and updated. Date reviewed: Chart in Epic.   Past Medical History:  Diagnosis Date  . Blind left eye     Past Surgical History:  Procedure Laterality Date  . EYE SURGERY      Social History   Socioeconomic History  . Marital status: Legally Separated    Spouse name: Not on file  . Number of children: Not on file  . Years of education: Not on file  . Highest education level: Not on file  Occupational History  . Not on file  Tobacco Use  . Smoking status: Current Some Day Smoker    Packs/day: 1.00    Types: Cigarettes  . Smokeless tobacco: Never Used  Substance and Sexual Activity  . Alcohol use: No    Comment: occassional  . Drug use: No  . Sexual activity: Never    Birth control/protection: Condom  Other Topics Concern  . Not on file  Social History Narrative  . Not on file   Social Determinants of Health   Financial Resource Strain:   . Difficulty of Paying Living Expenses: Not on file  Food Insecurity:   . Worried About Charity fundraiser in the Last Year: Not on file  . Ran Out of Food in the Last Year: Not on file  Transportation Needs:   . Lack of Transportation (Medical):  Not on file  . Lack of Transportation (Non-Medical): Not on file  Physical Activity:   . Days of Exercise per Week: Not on file  . Minutes of Exercise per Session: Not on file  Stress:   . Feeling of Stress : Not on file  Social Connections:   . Frequency of Communication with Friends and Family: Not on file  . Frequency of Social Gatherings with Friends and Family: Not on file  . Attends Religious Services: Not on file  . Active Member of Clubs or Organizations: Not on file  . Attends Archivist Meetings: Not on file  . Marital Status: Not on file  Intimate Partner Violence:   . Fear of Current or Ex-Partner: Not on file  . Emotionally Abused: Not on file  . Physically Abused: Not on file  . Sexually Abused: Not on file    Outpatient Encounter Medications as of 05/07/2019  Medication Sig  . [DISCONTINUED] predniSONE (DELTASONE) 20 MG tablet 2 po at sametime daily for 5 days   No facility-administered encounter medications on file as of 05/07/2019.    No Known Allergies  Review of Systems  Constitutional: Negative for activity change, appetite change, chills, fatigue, fever and unexpected weight change.  HENT: Negative.   Eyes: Positive  for visual disturbance. Negative for photophobia, pain, discharge, redness and itching.  Respiratory: Negative for cough, chest tightness and shortness of breath.   Cardiovascular: Negative for chest pain, palpitations and leg swelling.  Gastrointestinal: Negative for blood in stool, constipation, diarrhea, nausea and vomiting.  Endocrine: Negative.   Genitourinary: Negative for decreased urine volume, difficulty urinating, dysuria, frequency and urgency.  Musculoskeletal: Negative for arthralgias and myalgias.  Skin: Negative.   Allergic/Immunologic: Negative.   Neurological: Negative for dizziness, tremors, seizures, syncope, facial asymmetry, speech difficulty, weakness, light-headedness, numbness and headaches.  Hematological:  Negative.   Psychiatric/Behavioral: Negative for confusion, hallucinations, sleep disturbance and suicidal ideas.  All other systems reviewed and are negative.       Objective:  BP 136/84   Pulse 85   Temp 98.4 F (36.9 C)   Resp 20   Ht '6\' 1"'  (1.854 m)   Wt 236 lb (107 kg)   SpO2 96%   BMI 31.14 kg/m    Wt Readings from Last 3 Encounters:  05/07/19 236 lb (107 kg)  03/19/18 231 lb (104.8 kg)  07/07/17 222 lb (100.7 kg)    Physical Exam Vitals and nursing note reviewed.  Constitutional:      General: He is not in acute distress.    Appearance: Normal appearance. He is well-developed and well-groomed. He is not ill-appearing, toxic-appearing or diaphoretic.  HENT:     Head: Normocephalic and atraumatic.     Jaw: There is normal jaw occlusion.     Right Ear: Hearing, tympanic membrane, ear canal and external ear normal.     Left Ear: Hearing, tympanic membrane, ear canal and external ear normal.     Nose: Nose normal.     Mouth/Throat:     Lips: Pink.     Mouth: Mucous membranes are moist.     Pharynx: Oropharynx is clear. Uvula midline.  Eyes:     General: Lids are normal. Visual field deficit (left, legally blind) present.        Right eye: No foreign body, discharge or hordeolum.     Extraocular Movements: Extraocular movements intact.     Right eye: Normal extraocular motion and no nystagmus.     Conjunctiva/sclera: Conjunctivae normal.     Right eye: Right conjunctiva is not injected. No chemosis, exudate or hemorrhage.    Pupils:     Right eye: Pupil is round, reactive and not sluggish. No corneal abrasion or fluorescein uptake. Seidel exam negative.     Funduscopic exam:    Right eye: No hemorrhage, exudate, AV nicking, arteriolar narrowing or papilledema. Red reflex and venous pulsations present.     Slit lamp exam:    Right eye: Anterior chamber quiet.     Visual Fields: Right eye visual fields normal.  Neck:     Thyroid: No thyroid mass, thyromegaly or  thyroid tenderness.     Vascular: No carotid bruit or JVD.     Trachea: Trachea and phonation normal.  Cardiovascular:     Rate and Rhythm: Normal rate and regular rhythm.     Chest Wall: PMI is not displaced.     Pulses: Normal pulses.     Heart sounds: Normal heart sounds. No murmur. No friction rub. No gallop.   Pulmonary:     Effort: Pulmonary effort is normal. No respiratory distress.     Breath sounds: Normal breath sounds. No wheezing.  Abdominal:     General: Bowel sounds are normal. There is no distension or abdominal  bruit.     Palpations: Abdomen is soft. There is no hepatomegaly or splenomegaly.     Tenderness: There is no abdominal tenderness. There is no right CVA tenderness or left CVA tenderness.     Hernia: No hernia is present.  Musculoskeletal:        General: Normal range of motion.     Cervical back: Normal range of motion and neck supple.     Right lower leg: No edema.     Left lower leg: No edema.  Lymphadenopathy:     Cervical: No cervical adenopathy.  Skin:    General: Skin is warm and dry.     Capillary Refill: Capillary refill takes less than 2 seconds.     Coloration: Skin is not cyanotic, jaundiced or pale.     Findings: No rash.  Neurological:     General: No focal deficit present.     Mental Status: He is alert and oriented to person, place, and time.     Cranial Nerves: No cranial nerve deficit.     Sensory: Sensation is intact. No sensory deficit.     Motor: Motor function is intact. No weakness.     Coordination: Coordination is intact. Coordination normal.     Gait: Gait is intact. Gait normal.     Deep Tendon Reflexes: Reflexes are normal and symmetric. Reflexes normal.  Psychiatric:        Attention and Perception: Attention and perception normal.        Mood and Affect: Mood and affect normal.        Speech: Speech normal.        Behavior: Behavior normal. Behavior is cooperative.        Thought Content: Thought content normal.         Cognition and Memory: Cognition and memory normal.        Judgment: Judgment normal.     Results for orders placed or performed in visit on 08/24/16  CBC with Differential/Platelet  Result Value Ref Range   WBC 5.2 3.4 - 10.8 x10E3/uL   RBC 5.20 4.14 - 5.80 x10E6/uL   Hemoglobin 15.6 13.0 - 17.7 g/dL   Hematocrit 45.1 37.5 - 51.0 %   MCV 87 79 - 97 fL   MCH 30.0 26.6 - 33.0 pg   MCHC 34.6 31.5 - 35.7 g/dL   RDW 14.5 12.3 - 15.4 %   Platelets 272 150 - 379 x10E3/uL   Neutrophils 43 Not Estab. %   Lymphs 49 Not Estab. %   Monocytes 7 Not Estab. %   Eos 1 Not Estab. %   Basos 0 Not Estab. %   Neutrophils Absolute 2.3 1.4 - 7.0 x10E3/uL   Lymphocytes Absolute 2.5 0.7 - 3.1 x10E3/uL   Monocytes Absolute 0.4 0.1 - 0.9 x10E3/uL   EOS (ABSOLUTE) 0.1 0.0 - 0.4 x10E3/uL   Basophils Absolute 0.0 0.0 - 0.2 x10E3/uL   Immature Granulocytes 0 Not Estab. %   Immature Grans (Abs) 0.0 0.0 - 0.1 x10E3/uL  CMP14+EGFR  Result Value Ref Range   Glucose 88 65 - 99 mg/dL   BUN 11 6 - 24 mg/dL   Creatinine, Ser 1.24 0.76 - 1.27 mg/dL   GFR calc non Af Amer 65 >59 mL/min/1.73   GFR calc Af Amer 75 >59 mL/min/1.73   BUN/Creatinine Ratio 9 9 - 20   Sodium 142 134 - 144 mmol/L   Potassium 4.5 3.5 - 5.2 mmol/L   Chloride 105 96 - 106  mmol/L   CO2 22 20 - 29 mmol/L   Calcium 9.8 8.7 - 10.2 mg/dL   Total Protein 7.0 6.0 - 8.5 g/dL   Albumin 4.7 3.5 - 5.5 g/dL   Globulin, Total 2.3 1.5 - 4.5 g/dL   Albumin/Globulin Ratio 2.0 1.2 - 2.2   Bilirubin Total 0.3 0.0 - 1.2 mg/dL   Alkaline Phosphatase 80 39 - 117 IU/L   AST 23 0 - 40 IU/L   ALT 27 0 - 44 IU/L       Pertinent labs & imaging results that were available during my care of the patient were reviewed by me and considered in my medical decision making.  Assessment & Plan:  [name] was seen today for blurred vision.  Diagnoses and all orders for this visit:  Blurred vision, right eye Vision 20/20 in OD today. Will refer to opthamology  for detailed eye exam.  -     Ambulatory referral to Ophthalmology     Continue all other maintenance medications.  Follow up plan: Return if symptoms worsen or fail to improve.  Continue healthy lifestyle choices, including diet (rich in fruits, vegetables, and lean proteins, and low in salt and simple carbohydrates) and exercise (at least 30 minutes of moderate physical activity daily).  Educational handout given for blurred vision   The above assessment and management plan was discussed with the patient. The patient verbalized understanding of and has agreed to the management plan. Patient is aware to call the clinic if they develop any new symptoms or if symptoms persist or worsen. Patient is aware when to return to the clinic for a follow-up visit. Patient educated on when it is appropriate to go to the emergency department.   Monia Pouch, FNP-C [facility] [phone]

## 2019-05-07 NOTE — Patient Instructions (Signed)
Dr. Conley Rolls, [facility]  Blurred Vision, Adult        Having blurred vision means that you cannot see things clearly. Your vision may seem fuzzy or out of focus. It can involve your vision for objects that are close or far away. It may affect one or both eyes. There are many causes of blurred vision, including cataracts, macular degeneration, eye inflammation (uveitis), and diabetic retinopathy. In many cases, blurred vision has to do with the shape of your eye. An abnormal eye shape means you cannot focus well (refractive error). When this happens, it can cause:  Faraway objects to look blurry (nearsightedness).  Close objects to look blurry (farsightedness).  Blurry vision at any distance (astigmatism). Refractive errors are often corrected with glasses or contacts. Blurred vision can be diagnosed based on your symptoms and a physical exam. Tell your health care provider about any other health problems you have, any recent eye injury, and any prior surgeries. You may need to see a health care provider who specializes in eye problems (ophthalmologist). Your treatment will depend on what is causing your blurred vision. Follow these instructions at home:  Keep all follow-up visits as told by your health care provider. This is important. These include any visits to your eye specialists.  Do not drive or use heavy machinery if your vision is blurry.  Use eye drops only as told by your health care provider.  If you were prescribed glasses or contact lenses, wear the glasses or contacts as told by your health care provider.  Schedule eye exams regularly.  Pay attention to any changes in your symptoms. Contact a health care provider if:  Your symptoms do not improve or they get worse.  You have: ? New symptoms. ? A headache. ? Trouble seeing at night. ? Trouble noticing the difference between colors.  You notice: ? Drooping of your eyelids. ? Drainage coming from your eyes. ? A rash  around your eyes. Get help right away if:  You have: ? Severe eye pain. ? A severe headache. ? A sudden change in vision. ? A sudden loss of vision. ? A vision change after an injury.  You notice flashing lights in your field of vision. Your field of vision is the area that you can see without moving your eyes. Summary  Having blurred vision means that you cannot see things clearly. Your vision may seem fuzzy or out of focus.  There are many causes of blurred vision. In many cases, blurred vision has to do with an abnormal eye shape (refractive error), and it can be corrected with glasses or contact lenses.  Pay attention to any changes in your symptoms. Contact a health care provider if your symptoms do not improve or if you have any new symptoms. This information is not intended to replace advice given to you by your health care provider. Make sure you discuss any questions you have with your health care provider. Document Revised: 05/22/2017 Document Reviewed: 06/14/2016 Elsevier Patient Education  2020 [employer].

## 2019-11-30 ENCOUNTER — Telehealth: Payer: Self-pay | Admitting: Family Medicine

## 2019-11-30 NOTE — Telephone Encounter (Signed)
Suggested to pt that he needs an appt to discuss his concerns. Informed that I could only suggest OTC medications. Pt will try that first and call back if needed. He will try Melatonin first. Instructed that this could take a couple of weeks to get in his system. He also could try Tylenol pm but that it would not be something that he should stay on continuously.

## 2020-08-30 ENCOUNTER — Ambulatory Visit: Payer: 59 | Admitting: Physician Assistant

## 2020-08-30 ENCOUNTER — Encounter: Payer: Self-pay | Admitting: Family Medicine

## 2021-02-03 ENCOUNTER — Emergency Department (HOSPITAL_COMMUNITY): Payer: 59

## 2021-02-03 ENCOUNTER — Emergency Department (HOSPITAL_COMMUNITY)
Admission: EM | Admit: 2021-02-03 | Discharge: 2021-02-03 | Disposition: A | Discharge status: Home / Self Care | Payer: 59 | Attending: Emergency Medicine | Admitting: Emergency Medicine

## 2021-02-03 ENCOUNTER — Encounter (HOSPITAL_COMMUNITY): Payer: Self-pay | Admitting: *Deleted

## 2021-02-03 ENCOUNTER — Other Ambulatory Visit: Payer: Self-pay

## 2021-02-03 DIAGNOSIS — F17210 Nicotine dependence, cigarettes, uncomplicated: Secondary | ICD-10-CM | POA: Insufficient documentation

## 2021-02-03 DIAGNOSIS — S39012A Strain of muscle, fascia and tendon of lower back, initial encounter: Secondary | ICD-10-CM | POA: Insufficient documentation

## 2021-02-03 DIAGNOSIS — M62830 Muscle spasm of back: Secondary | ICD-10-CM

## 2021-02-03 DIAGNOSIS — S3992XA Unspecified injury of lower back, initial encounter: Secondary | ICD-10-CM | POA: Diagnosis present

## 2021-02-03 DIAGNOSIS — X58XXXA Exposure to other specified factors, initial encounter: Secondary | ICD-10-CM | POA: Insufficient documentation

## 2021-02-03 LAB — URINALYSIS, ROUTINE W REFLEX MICROSCOPIC
Bilirubin Urine: NEGATIVE
Glucose, UA: NEGATIVE mg/dL
Hgb urine dipstick: NEGATIVE
Ketones, ur: NEGATIVE mg/dL
Leukocytes,Ua: NEGATIVE
Nitrite: NEGATIVE
Protein, ur: NEGATIVE mg/dL
Specific Gravity, Urine: >1.03 — ABNORMAL HIGH (ref 1.005–1.030)
pH: 5 (ref 5.0–8.0)

## 2021-02-03 MED ORDER — METHOCARBAMOL 500 MG PO TABS
500.0000 mg | ORAL_TABLET | Freq: Once | ORAL | Status: AC
  Administered 2021-02-03: 500 mg via ORAL
  Filled 2021-02-03: qty 1

## 2021-02-03 MED ORDER — METHOCARBAMOL 500 MG PO TABS: 500.0000 mg | ORAL_TABLET | Freq: Three times a day (TID) | ORAL | 0 refills | Status: DC

## 2021-02-03 MED ORDER — OXYCODONE-ACETAMINOPHEN 5-325 MG PO TABS: 1.0000 | ORAL_TABLET | Freq: Four times a day (QID) | ORAL | 0 refills | Status: DC | PRN

## 2021-02-03 MED ORDER — OXYCODONE-ACETAMINOPHEN 5-325 MG PO TABS
1.0000 | ORAL_TABLET | Freq: Once | ORAL | Status: AC
  Administered 2021-02-03: 1 via ORAL
  Filled 2021-02-03: qty 1

## 2021-02-03 NOTE — ED Triage Notes (Signed)
Pt with right lower back all day today.  Denies any injury. Pain worse with getting up.  Denies any blood in urine or pain with urination.

## 2021-02-03 NOTE — Discharge Instructions (Signed)
Your work-up this evening was reassuring.  Your back pain likely muscle related.  I recommend that you alternate ice and heat to your lower back.  Avoid twisting, bending or straining for at least 1 week.  Take medication as directed.  This may cause drowsiness.  Follow-up with your primary care provider for recheck if not improving.  Return to the emergency department for any new or worsening symptoms.

## 2021-02-03 NOTE — ED Notes (Signed)
Pt reports last taking Aleve for pain control PTA without relief. Pt localized to right posterior flank. Pt denies injury.

## 2021-02-03 NOTE — ED Provider Notes (Signed)
[facility] EMERGENCY DEPARTMENT Provider Note   CSN: [id number] Arrival date & time: 02/03/21  1952     History Chief Complaint  Patient presents with   Back Pain    [name] is a 60 y.o. male.   Back Pain Associated symptoms: no abdominal pain, no chest pain, no dysuria, no fever, no numbness and no weakness        [name] is a 60 y.o. male who presents to the Emergency Department complaining of right-sided low back pain since earlier today.  He describes sharp pain that is localized to his right flank area.  Pain is associated with movement and walking.  Pain resolves while at rest.  He denies abdominal pain, pain numbness or weakness of his lower extremities, fever, chills, dysuria, bloody urine or bowel changes.  No groin pain.  He has tried several over-the-counter pain relievers without improvement.  No known injury but states that he performs strenuous movements at his job.  No history of kidney stones   Past Medical History:  Diagnosis Date   Blind left eye     Patient Active Problem List   Diagnosis Date Noted   Tobacco use disorder 01/27/2015   BMI 29.0-29.9,adult 01/27/2015    Past Surgical History:  Procedure Laterality Date   EYE SURGERY         Family History  Problem Relation Age of Onset   Diabetes Sister    Alzheimer's disease Father     Social History   Tobacco Use   Smoking status: Some Days    Packs/day: 1.00    Types: Cigarettes   Smokeless tobacco: Never  Vaping Use   Vaping Use: Never used  Substance Use Topics   Alcohol use: No    Comment: occassional   Drug use: No    Home Medications Prior to Admission medications   Medication Sig Start Date End Date Taking? Authorizing Provider  methocarbamol (ROBAXIN) 500 MG tablet Take 1 tablet (500 mg total) by mouth 3 (three) times daily. May cause drowsiness 02/03/21  Yes Kishan Wachsmuth, PA-C  oxyCODONE-acetaminophen (PERCOCET/ROXICET) 5-325 MG tablet Take 1  tablet by mouth every 6 (six) hours as needed for severe pain. 02/03/21  Yes Teofil Maniaci, PA-C    Allergies    Patient has no known allergies.  Review of Systems   Review of Systems  Constitutional:  Negative for chills, fatigue and fever.  Respiratory:  Negative for cough, shortness of breath and wheezing.   Cardiovascular:  Negative for chest pain and palpitations.  Gastrointestinal:  Negative for abdominal pain, constipation, nausea and vomiting.  Genitourinary:  Positive for flank pain. Negative for decreased urine volume, difficulty urinating, dysuria, hematuria, penile swelling, scrotal swelling and testicular pain.  Musculoskeletal:  Positive for back pain. Negative for arthralgias, joint swelling, myalgias, neck pain and neck stiffness.  Skin:  Negative for color change and rash.  Neurological:  Negative for dizziness, weakness and numbness.  Hematological:  Does not bruise/bleed easily.  All other systems reviewed and are negative.  Physical Exam Updated Vital Signs BP 136/85 (BP Location: Right Arm)   Pulse 82   Temp 98 F (36.7 C) (Oral)   Resp 18   Ht 6' (1.829 m)   Wt 108.9 kg   SpO2 91%   BMI 32.55 kg/m   Physical Exam Vitals and nursing note reviewed.  Constitutional:      General: He is not in acute distress.    Appearance: Normal appearance.  Cardiovascular:  Rate and Rhythm: Normal rate and regular rhythm.  Pulmonary:     Effort: Pulmonary effort is normal. No respiratory distress.  Abdominal:     General: There is no distension.     Palpations: Abdomen is soft.     Tenderness: There is no abdominal tenderness. There is no right CVA tenderness, left CVA tenderness or guarding.  Musculoskeletal:        General: Tenderness present.     Lumbar back: Tenderness present. No swelling or deformity. Normal range of motion.       Back:     Right lower leg: No edema.     Left lower leg: No edema.     Comments: Focal tenderness palpation right/flank  and lumbar region.  No midline tenderness.  Pain reproduced with straight leg raise on the right at 30 degrees.  Skin:    General: Skin is warm.     Capillary Refill: Capillary refill takes less than 2 seconds.     Findings: No rash.  Neurological:     General: No focal deficit present.     Mental Status: He is alert.     Sensory: No sensory deficit.     Motor: No weakness.    ED Results / Procedures / Treatments   Labs (all labs ordered are listed, but only abnormal results are displayed) Labs Reviewed  URINALYSIS, ROUTINE W REFLEX MICROSCOPIC - Abnormal; Notable for the following components:      Result Value   Specific Gravity, Urine >1.030 (*)    All other components within normal limits    EKG None  Radiology CT Renal Stone Study  Result Date: 02/03/2021 CLINICAL DATA:  Right lower back pain. EXAM: CT ABDOMEN AND PELVIS WITHOUT CONTRAST TECHNIQUE: Multidetector CT imaging of the abdomen and pelvis was performed following the standard protocol without IV contrast. COMPARISON:  None. FINDINGS: Lower chest: No acute abnormality. Hepatobiliary: No focal liver abnormality is seen. No gallstones, gallbladder wall thickening, or biliary dilatation. Pancreas: Unremarkable. No pancreatic ductal dilatation or surrounding inflammatory changes. Spleen: Normal in size without focal abnormality. Adrenals/Urinary Tract: Adrenal glands are unremarkable. Kidneys are normal, without renal calculi, focal lesion, or hydronephrosis. Urinary bladder is partially contracted and subsequently limited in evaluation. Mild diffuse urinary bladder wall thickening is also seen. Stomach/Bowel: Stomach is within normal limits. Appendix appears normal. No evidence of bowel wall thickening, distention, or inflammatory changes. Noninflamed diverticula are seen within the sigmoid colon. Vascular/Lymphatic: No significant vascular findings are present. No enlarged abdominal or pelvic lymph nodes. Reproductive: Prostate  is unremarkable. Other: A 2.9 cm x 2.5 cm left-sided fat containing paraumbilical hernia is seen. No abdominopelvic ascites. Musculoskeletal: No acute or significant osseous findings. IMPRESSION: 1. Mild diffuse urinary bladder wall thickening, which may represent cystitis. Correlation with urinalysis is recommended. 2. Sigmoid diverticulosis. 3. Fat-containing paraumbilical hernia. Electronically Signed   By: Virgina Norfolk M.D.   On: 02/03/2021 22:50    Procedures Procedures   Medications Ordered in ED Medications  oxyCODONE-acetaminophen (PERCOCET/ROXICET) 5-325 MG per tablet 1 tablet (1 tablet Oral Given 02/03/21 2217)  methocarbamol (ROBAXIN) tablet 500 mg (500 mg Oral Given 02/03/21 2217)    ED Course  I have reviewed the triage vital signs and the nursing notes.  Pertinent labs & imaging results that were available during my care of the patient were reviewed by me and considered in my medical decision making (see chart for details).    MDM Rules/Calculators/A&P  Patient here for evaluation of focal right low back pain.  No history of injury but states that he performs strenuous activities at his job.  He has tried multiple over-the-counter medications without relief.  Pain is associated with movement only and completely resolves while at rest.  On exam, patient well-appearing nontoxic.  Has focal tenderness of the right flank/lower lumbar region.  Ambulates in the department with slow but steady gait.  No focal neurodeficits.  No saddle anesthesias or other symptoms concerning for cauda equina.  Denies any history of kidney stones or dysuria.    CT scan without evidence of kidney stone, source of thickening of bladder wall seen on CT unclear.  Patient denies any urinary symptoms and urinalysis without evidence of infection.  Patient's symptoms addressed here with medication.  Felt to be musculoskeletal.  Discussed findings, all questions were answered.   He is agreeable to outpatient follow-up if needed.  Return precautions were discussed.   Final Clinical Impression(s) / ED Diagnoses Final diagnoses:  Strain of lumbar region, initial encounter  Muscle spasm of back    Rx / DC Orders ED Discharge Orders          Ordered    oxyCODONE-acetaminophen (PERCOCET/ROXICET) 5-325 MG tablet  Every 6 hours PRN        02/03/21 2309    methocarbamol (ROBAXIN) 500 MG tablet  3 times daily        02/03/21 2309             Pauline Aus, PA-C 02/03/21 2325    Cheryll Cockayne, MD 02/07/21 1432

## 2021-02-05 ENCOUNTER — Telehealth (HOSPITAL_COMMUNITY): Payer: Self-pay | Admitting: Student

## 2021-02-05 ENCOUNTER — Telehealth (HOSPITAL_COMMUNITY): Payer: Self-pay | Admitting: Emergency Medicine

## 2021-02-05 MED ORDER — HYDROCODONE-ACETAMINOPHEN 5-325 MG PO TABS: 1.0000 | ORAL_TABLET | ORAL | 0 refills | Status: DC | PRN

## 2021-02-05 MED ORDER — HYDROCODONE-ACETAMINOPHEN 5-325 MG PO TABS: 1.0000 | ORAL_TABLET | Freq: Four times a day (QID) | ORAL | 0 refills | Status: DC | PRN

## 2021-02-05 NOTE — Telephone Encounter (Signed)
Correct Rx sent. 

## 2021-02-05 NOTE — Telephone Encounter (Signed)
Seen 2 days ago, narcotic medication was prescribed unfortunately pharmacy is out of this, database was reviewed has not picked up the medications.  Will provide him with different narcotic medication to a different pharmacy.

## 2021-02-05 NOTE — Telephone Encounter (Signed)
Patient's pharmacy does not have the Percocet.  He is asking for a different pharmacy and I will prescribe hydrocodone as it seems like the oxycodone is on a backorder.

## 2021-09-28 ENCOUNTER — Telehealth: Payer: Self-pay | Admitting: Family Medicine

## 2021-09-28 NOTE — Telephone Encounter (Signed)
Pt called requesting to speak with nurse. Says he is having some personal issues and doesn't know if he needs to see his doctor about it or not. Wants to speak with nurse first.

## 2021-09-28 NOTE — Telephone Encounter (Signed)
Lower back issues - 7-8 mos ago - went to [facility], done some tests for possible kidney stone Nothing seen  went to Chiropractor in [place] and [place].  No help    Taking IBU  Drinking a lot water   Would like to come here for new work of the back pain.  Discussed possible degeneration (no xray in 5 yrs) Discussed possible prostate issues (no psa in chart)   Appt made for Monday with DOD, xray will be here and pcp is off

## 2021-10-01 ENCOUNTER — Encounter: Payer: Self-pay | Admitting: Family Medicine

## 2021-10-01 ENCOUNTER — Ambulatory Visit (INDEPENDENT_AMBULATORY_CARE_PROVIDER_SITE_OTHER): Payer: 59

## 2021-10-01 ENCOUNTER — Ambulatory Visit (INDEPENDENT_AMBULATORY_CARE_PROVIDER_SITE_OTHER): Payer: 59 | Admitting: Family Medicine

## 2021-10-01 VITALS — BP 140/63 | HR 65 | Temp 97.8°F | Ht 72.0 in | Wt 236.2 lb

## 2021-10-01 DIAGNOSIS — M5416 Radiculopathy, lumbar region: Secondary | ICD-10-CM

## 2021-10-01 DIAGNOSIS — Z87442 Personal history of urinary calculi: Secondary | ICD-10-CM

## 2021-10-01 LAB — URINALYSIS, ROUTINE W REFLEX MICROSCOPIC
Bilirubin, UA: NEGATIVE
Glucose, UA: NEGATIVE
Ketones, UA: NEGATIVE
Leukocytes,UA: NEGATIVE
Nitrite, UA: NEGATIVE
Protein,UA: NEGATIVE
RBC, UA: NEGATIVE
Specific Gravity, UA: <1.005 — ABNORMAL LOW (ref 1.005–1.030)
Urobilinogen, Ur: 0.2 mg/dL (ref 0.2–1.0)
pH, UA: 6 (ref 5.0–7.5)

## 2021-10-01 MED ORDER — METHYLPREDNISOLONE ACETATE 80 MG/ML IJ SUSP
80.0000 mg | Freq: Once | INTRAMUSCULAR | Status: AC
  Administered 2021-10-01: 80 mg via INTRAMUSCULAR

## 2021-10-01 MED ORDER — METHOCARBAMOL 500 MG PO TABS: 500.0000 mg | ORAL_TABLET | Freq: Four times a day (QID) | ORAL | 0 refills | Status: DC

## 2021-10-01 MED ORDER — KETOROLAC TROMETHAMINE 60 MG/2ML IM SOLN
60.0000 mg | Freq: Once | INTRAMUSCULAR | Status: AC
  Administered 2021-10-01: 60 mg via INTRAMUSCULAR

## 2021-10-01 MED ORDER — DICLOFENAC SODIUM 75 MG PO TBEC: 75.0000 mg | DELAYED_RELEASE_TABLET | Freq: Two times a day (BID) | ORAL | 2 refills | Status: DC

## 2021-10-01 NOTE — Progress Notes (Signed)
Acute Office Visit  Subjective:     Patient ID: [name], male    DOB: 1960/08/20, 62 y.o.   MRN: [id number]  Chief Complaint  Patient presents with   Back Pain    Back Pain This is a chronic problem. The current episode started more than 1 month ago. The problem occurs intermittently. The problem has been gradually worsening since onset. The pain is present in the lumbar spine and gluteal. The quality of the pain is described as aching, shooting, stabbing and cramping. The pain radiates to the right thigh and left thigh. The pain is moderate. The symptoms are aggravated by bending and position. Stiffness is present: after sitting. Associated symptoms include leg pain and tingling. Pertinent negatives include no abdominal pain, bladder incontinence, bowel incontinence, chest pain, dysuria, fever, headaches, numbness, paresis, paresthesias, pelvic pain, perianal numbness, weakness or weight loss. He has tried NSAIDs, analgesics, ice, heat, walking, bed rest, home exercises and chiropractic manipulation (biofreeze, corticosteroids) for the symptoms. The treatment provided no relief.     Review of Systems  Constitutional:  Negative for diaphoresis, fever, malaise/fatigue and weight loss.  Cardiovascular:  Negative for chest pain, claudication and leg swelling.  Gastrointestinal:  Negative for abdominal pain and bowel incontinence.  Genitourinary:  Negative for bladder incontinence, dysuria and pelvic pain.  Musculoskeletal:  Positive for back pain. Negative for falls.  Neurological:  Positive for tingling. Negative for weakness, numbness, headaches and paresthesias.        Objective:    BP 140/63   Pulse 65   Temp 97.8 F (36.6 C) (Temporal)   Ht 6' (1.829 m)   Wt 236 lb 4 oz (107.2 kg)   SpO2 97%   BMI 32.04 kg/m    Physical Exam Vitals and nursing note reviewed.  Constitutional:      General: He is not in acute distress.    Appearance: He is not ill-appearing,  toxic-appearing or diaphoretic.  Pulmonary:     Effort: Pulmonary effort is normal. No respiratory distress.  Musculoskeletal:     Lumbar back: No swelling, edema, deformity, signs of trauma, tenderness or bony tenderness. Normal range of motion. Positive right straight leg raise test and positive left straight leg raise test.     Right lower leg: No edema.     Left lower leg: No edema.  Skin:    General: Skin is warm and dry.  Neurological:     General: No focal deficit present.     Mental Status: He is alert and oriented to person, place, and time.     Motor: No weakness.     Gait: Gait normal.  Psychiatric:        Mood and Affect: Mood normal.        Behavior: Behavior normal.     No results found for any visits on 10/01/21.      Assessment & Plan:   Amon was seen today for back pain.  Diagnoses and all orders for this visit:  Lumbar back pain with radiculopathy affecting lower extremity Chronic, not well controlled. Xray with mild degenerative changes today. Steroid and toradol IM injection today. Start Voltaren tomorrow, do not take other NSAIDs with this. Referral to PT placed. Discussed MRI if no improvement after 6 weeks. Discussed reasons for emergent evaluation.  -     DG Lumbar Spine 2-3 Views; Future -     diclofenac (VOLTAREN) 75 MG EC tablet; Take 1 tablet (75 mg total) by mouth 2 (two)  times daily. -     methocarbamol (ROBAXIN) 500 MG tablet; Take 1 tablet (500 mg total) by mouth 4 (four) times daily. -     methylPREDNISolone acetate (DEPO-MEDROL) injection 80 mg -     ketorolac (TORADOL) injection 60 mg -     Ambulatory referral to Physical Therapy  History of kidney stones -     Urinalysis, Routine w reflex microscopic   Return to office for new or worsening symptoms, or if symptoms persist.  Return in about 6 weeks (around 11/12/2021) for with PCP for back pain.  The patient indicates understanding of these issues and agrees with the  plan.  Gabriel Earing, FNP

## 2021-10-01 NOTE — Patient Instructions (Signed)
Radicular Pain Radicular pain is a type of pain that spreads from your back or neck along a spinal nerve. Spinal nerves are nerves that leave the spinal cord and go to the muscles. Radicular pain is sometimes called radiculopathy, radiculitis, or a pinched nerve. When you have this type of pain, you may also have weakness, numbness, or tingling in the area of your body that is supplied by the nerve. The pain may feel sharp and burning. Depending on which spinal nerve is affected, the pain may occur in the: Neck area (cervical radicular pain). You may also feel pain, numbness, weakness, or tingling in the arms. Mid-spine area (thoracic radicular pain). You would feel this pain in the back and chest. This type is rare. Lower back area (lumbar radicular pain). You would feel this pain as low back pain. You may feel pain, numbness, weakness, or tingling in the buttocks or legs. Sciatica is a type of lumbar radicular pain that shoots down the back of the leg. Radicular pain occurs when one of the spinal nerves becomes irritated or squeezed (compressed). It is often caused by something pushing on a spinal nerve, such as one of the bones of the spine (vertebrae) or one of the round cushions between vertebrae (intervertebral disks). This can result from: An injury. Wear and tear or aging of a disk. The growth of a bone spur that pushes on the nerve. Radicular pain often goes away when you follow instructions from your health care provider for relieving pain at home. How is this treated? Treatment may depend on the cause of the condition and may include: Working with a physical therapist. Taking pain medicine. Applying heat or ice or both to the affected areas. Doing stretches to improve flexibility. Having surgery. This may be needed if other treatments do not help. Different types of surgery may be done depending on the cause of this condition. Follow these instructions at home: Managing pain     If  directed, put ice on the affected area. To do this: Put ice in a plastic bag. Place a towel between your skin and the bag. Leave the ice on for 20 minutes, 2-3 times a day. Remove the ice if your skin turns bright red. This is very important. If you cannot feel pain, heat, or cold, you have a greater risk of damage to the area. If directed, apply heat to the affected area as often as told by your health care provider. Use the heat source that your health care provider recommends, such as a moist heat pack or a heating pad. Place a towel between your skin and the heat source. Leave the heat on for 20-30 minutes. Remove the heat if your skin turns bright red. This is especially important if you are unable to feel pain, heat, or cold. You have a greater risk of getting burned. Activity Do not sit or rest in bed for long periods of time. Try to stay as active as possible. Ask your health care provider what type of exercise or activity is best for you. Avoid activities that make your pain worse, such as bending and lifting. You may have to avoid lifting. Ask your health care provider how much you can safely lift. Practice using proper technique when lifting items. Proper lifting technique involves bending your knees and rising up. Do strength and range-of-motion exercises only as told by your health care provider or physical therapist. General instructions Take over-the-counter and prescription medicines only as told by your   health care provider. Pay attention to any changes in your symptoms. Keep all follow-up visits. This is important. Contact a health care provider if: Your pain and other symptoms get worse. Your pain medicine is not helping. Your pain has not improved after a few weeks of home care. You have a fever. Get help right away if: You have severe pain, weakness, or numbness. You have difficulty with bladder or bowel control. Summary Radicular pain is a type of pain that spreads  from your back or neck along a spinal nerve. When you have radicular pain, you may also have weakness, numbness, or tingling in the area of your body that is supplied by the nerve. The pain may feel sharp or burning. Radicular pain may be treated with ice, heat, medicines, or physical therapy. This information is not intended to replace advice given to you by your health care provider. Make sure you discuss any questions you have with your health care provider. Document Revised: 08/31/2020 Document Reviewed: 08/31/2020 Elsevier Patient Education  2023 [employer]  

## 2021-10-02 ENCOUNTER — Telehealth: Payer: Self-pay | Admitting: Family Medicine

## 2021-10-02 NOTE — Telephone Encounter (Signed)
Patient came to office to pick up note. Aware that the note had not been written and would like for someone to call when it is ready.

## 2021-10-02 NOTE — Telephone Encounter (Signed)
Please advise.  Patient was seen by Tiffany yesterday for back pain, given medication and referred to physical therapy.

## 2021-10-02 NOTE — Telephone Encounter (Signed)
Pt called stating that he needs PCP to write a note for him to give to his HR that states that he has had a back injury and due to his back injury, he doesn't need to be lifting more than 20-30 lbs.  Please advise and call pt when note is ready.

## 2021-10-03 NOTE — Telephone Encounter (Signed)
Ok to provide note

## 2021-10-03 NOTE — Telephone Encounter (Signed)
Letter written and printed and left message for pt to call back with any questions or concerns.

## 2021-10-10 ENCOUNTER — Ambulatory Visit: Payer: 59 | Admitting: Physical Therapy

## 2021-11-05 ENCOUNTER — Encounter: Payer: Self-pay | Admitting: Nurse Practitioner

## 2021-11-05 ENCOUNTER — Ambulatory Visit (INDEPENDENT_AMBULATORY_CARE_PROVIDER_SITE_OTHER): Payer: 59 | Admitting: Nurse Practitioner

## 2021-11-05 DIAGNOSIS — U071 COVID-19: Secondary | ICD-10-CM | POA: Diagnosis not present

## 2021-11-05 MED ORDER — BENZONATATE 100 MG PO CAPS: 100.0000 mg | ORAL_CAPSULE | Freq: Three times a day (TID) | ORAL | 0 refills | Status: DC | PRN

## 2021-11-05 MED ORDER — MOLNUPIRAVIR EUA 200MG CAPSULE: 4.0000 | ORAL_CAPSULE | Freq: Two times a day (BID) | ORAL | 0 refills | Status: AC

## 2021-11-05 NOTE — Progress Notes (Signed)
   Virtual Visit  Note Due to COVID-19 pandemic this visit was conducted virtually. This visit type was conducted due to national recommendations for restrictions regarding the COVID-19 Pandemic (e.g. social distancing, sheltering in place) in an effort to limit this patient's exposure and mitigate transmission in our community. All issues noted in this document were discussed and addressed.  A physical exam was not performed with this format.  I connected with [name] on 11/05/21 at 11 am  by telephone and verified that I am speaking with the correct person using two identifiers. [name] is currently located at home televisit patient not in distress during visit. The provider, Daryll Drown, NP is located in their office at time of visit.  I discussed the limitations, risks, security and privacy concerns of performing an evaluation and management service by telephone and the availability of in person appointments. I also discussed with the patient that there may be a patient responsible charge related to this service. The patient expressed understanding and agreed to proceed.   History and Present Illness:  URI  This is a new problem. The current episode started in the past 7 days (In the past 3 to 4 days). The problem has been gradually improving. There has been no fever. Associated symptoms include congestion and coughing. Pertinent negatives include no rash. He has tried nothing for the symptoms.      Review of Systems  Constitutional: Negative.  Negative for chills and fever.  HENT:  Positive for congestion.   Respiratory:  Positive for cough.   Cardiovascular: Negative.   Gastrointestinal: Negative.   Genitourinary: Negative.   Skin: Negative.  Negative for itching and rash.  Neurological: Negative.   All other systems reviewed and are negative.    Observations/Objective: Televisit patient not in distress  Assessment and Plan: Positive COVID-19 at home  testing Take meds as prescribed - Use a cool mist humidifier  -Use saline nose sprays frequently -Force fluids -For fever or aches or pains- take Tylenol or ibuprofen. -Molnupirivir education provided to patient, patient verbalized understanding.  Rx sent to pharmacy.   Follow Up Instructions: Follow up with worsening unresolved symptoms     I discussed the assessment and treatment plan with the patient. The patient was provided an opportunity to ask questions and all were answered. The patient agreed with the plan and demonstrated an understanding of the instructions.   The patient was advised to call back or seek an in-person evaluation if the symptoms worsen or if the condition fails to improve as anticipated.  The above assessment and management plan was discussed with the patient. The patient verbalized understanding of and has agreed to the management plan. Patient is aware to call the clinic if symptoms persist or worsen. Patient is aware when to return to the clinic for a follow-up visit. Patient educated on when it is appropriate to go to the emergency department.   Time call ended: 11:12 AM  I provided 12 minutes of  non face-to-face time during this encounter.    Daryll Drown, NP

## 2021-11-05 NOTE — Patient Instructions (Signed)

## 2021-11-21 ENCOUNTER — Ambulatory Visit: Payer: 59 | Admitting: Family Medicine

## 2021-11-22 ENCOUNTER — Encounter: Payer: Self-pay | Admitting: Family Medicine

## 2021-12-27 ENCOUNTER — Encounter: Payer: Self-pay | Admitting: Family Medicine

## 2021-12-27 ENCOUNTER — Ambulatory Visit (INDEPENDENT_AMBULATORY_CARE_PROVIDER_SITE_OTHER): Payer: 59 | Admitting: Family Medicine

## 2021-12-27 ENCOUNTER — Ambulatory Visit (INDEPENDENT_AMBULATORY_CARE_PROVIDER_SITE_OTHER): Payer: 59

## 2021-12-27 VITALS — BP 132/84 | HR 86 | Temp 98.3°F | Ht 73.0 in | Wt 241.8 lb

## 2021-12-27 DIAGNOSIS — Z1159 Encounter for screening for other viral diseases: Secondary | ICD-10-CM | POA: Diagnosis not present

## 2021-12-27 DIAGNOSIS — Z87891 Personal history of nicotine dependence: Secondary | ICD-10-CM

## 2021-12-27 DIAGNOSIS — Z1212 Encounter for screening for malignant neoplasm of rectum: Secondary | ICD-10-CM

## 2021-12-27 DIAGNOSIS — Z6831 Body mass index (BMI) 31.0-31.9, adult: Secondary | ICD-10-CM

## 2021-12-27 DIAGNOSIS — Z114 Encounter for screening for human immunodeficiency virus [HIV]: Secondary | ICD-10-CM

## 2021-12-27 DIAGNOSIS — Z130 Encounter for screening for diseases of the blood and blood-forming organs and certain disorders involving the immune mechanism: Secondary | ICD-10-CM

## 2021-12-27 DIAGNOSIS — Z1211 Encounter for screening for malignant neoplasm of colon: Secondary | ICD-10-CM

## 2021-12-27 DIAGNOSIS — Z0000 Encounter for general adult medical examination without abnormal findings: Secondary | ICD-10-CM

## 2021-12-27 DIAGNOSIS — R911 Solitary pulmonary nodule: Secondary | ICD-10-CM

## 2021-12-27 DIAGNOSIS — Z2821 Immunization not carried out because of patient refusal: Secondary | ICD-10-CM

## 2021-12-27 DIAGNOSIS — E041 Nontoxic single thyroid nodule: Secondary | ICD-10-CM

## 2021-12-27 DIAGNOSIS — Z125 Encounter for screening for malignant neoplasm of prostate: Secondary | ICD-10-CM | POA: Diagnosis not present

## 2021-12-27 DIAGNOSIS — Z1329 Encounter for screening for other suspected endocrine disorder: Secondary | ICD-10-CM

## 2021-12-27 DIAGNOSIS — Z13220 Encounter for screening for lipoid disorders: Secondary | ICD-10-CM | POA: Diagnosis not present

## 2021-12-27 DIAGNOSIS — R9389 Abnormal findings on diagnostic imaging of other specified body structures: Secondary | ICD-10-CM

## 2021-12-27 NOTE — Progress Notes (Signed)
[name] is a 61 y.o. male presents to office today for annual physical exam examination.    Concerns today include: 1. Ongoing low back pain, surgery planned, will do surgical clearance form today as well.   Occupation: Personal assistant Marital status: legally separated Substance use: denies Diet: generally healthy Exercise: not regular Last eye exam: over a year ago Last dental exam: yearly Last colonoscopy: never, cologuard ordered today Immunizations needed:  Flu Vaccine: declined  Tdap Vaccine: due 01/2022  Zoster Vaccine: declined  Past Medical History:  Diagnosis Date   Blind left eye    Social History   Socioeconomic History   Marital status: Legally Separated    Spouse name: Not on file   Number of children: Not on file   Years of education: Not on file   Highest education level: Not on file  Occupational History   Not on file  Tobacco Use   Smoking status: Some Days    Packs/day: 1.00    Types: Cigarettes   Smokeless tobacco: Never  Vaping Use   Vaping Use: Never used  Substance and Sexual Activity   Alcohol use: No    Comment: occassional   Drug use: No   Sexual activity: Never    Birth control/protection: Condom  Other Topics Concern   Not on file  Social History Narrative   Not on file   Social Determinants of Health   Financial Resource Strain: Not on file  Food Insecurity: Not on file  Transportation Needs: Not on file  Physical Activity: Not on file  Stress: Not on file  Social Connections: Not on file  Intimate Partner Violence: Not on file   Past Surgical History:  Procedure Laterality Date   EYE SURGERY     Family History  Problem Relation Age of Onset   Diabetes Sister    Alzheimer's disease Father    No current outpatient medications on file.  No Known Allergies    Review of Systems Constitutional: negative Eyes: positive for legally blind in left eye Ears, nose, mouth, throat, and face: negative Respiratory:  negative Cardiovascular: negative Gastrointestinal: negative Genitourinary:negative, nocturia Integument/breast: negative Hematologic/lymphatic: negative Musculoskeletal:positive for arthralgias and back pain Neurological: negative Behavioral/Psych: negative Endocrine: negative Allergic/Immunologic: negative    Physical exam BP 132/84   Pulse 86   Temp 98.3 F (36.8 C) (Temporal)   Ht _0  (1.854 m)   Wt 241 lb 12.8 oz (109.7 kg)   SpO2 94%   BMI 31.90 kg/m  General appearance: alert, cooperative, appears stated age, no distress, and mildly obese Head: Normocephalic, without obvious abnormality, atraumatic Eyes: positive findings: visual field defect blind in left eye Ears: normal TM's and external ear canals both ears Nose: Nares normal. Septum midline. Mucosa normal. No drainage or sinus tenderness. Throat: lips, mucosa, and tongue normal; teeth and gums normal Neck: no adenopathy, no carotid bruit, no JVD, supple, symmetrical, trachea midline, and thyroid not enlarged, symmetric, no tenderness/mass/nodules Back:  lower back tenderness with decreased ROM Lungs: clear to auscultation bilaterally and normal percussion bilaterally Chest wall: no tenderness Heart: regular rate and rhythm, S1, S2 normal, no murmur, click, rub or gallop Abdomen: soft, non-tender; bowel sounds normal; no masses,  no organomegaly Extremities: extremities normal, atraumatic, no cyanosis or edema Pulses: 2+ and symmetric Skin: Skin color, texture, turgor normal. No rashes or lesions Lymph nodes: Cervical, supraclavicular, and axillary nodes normal. Neurologic: Alert and oriented X 3, normal strength and tone. Normal symmetric reflexes. Normal coordination and gait  EKG: SR 78, PR 132 ms, QT 362 ms, no acute ST-T changes or ectopy, no changes from prior EKG. Monia Pouch, FNP-C  Assessment/ Plan: [name] was seen today for surgical clearance.  Diagnoses and all orders for this  visit:  Annual physical exam Health maintenance discussed in detail and updated. Declined all vaccinations today. EKG unremarkable.  -     EKG 12-Lead -     CMP14+EGFR -     Lipid panel -     HIV Antibody (routine testing w rflx) -     Hepatitis C antibody -     Thyroid Panel With TSH -     DG Chest 2 View; Future -     PSA, total and free -     CBC with Differential/Platelet -     Cologuard  Screening for prostate cancer -     PSA, total and free  Screening for lipid disorders -     Lipid panel  Need for hepatitis C screening test -     Hepatitis C antibody  Screening for HIV (human immunodeficiency virus) -     HIV Antibody (routine testing w rflx)  Screening for endocrine disorder -     CMP14+EGFR -     Thyroid Panel With TSH  Screening for deficiency anemia -     CBC with Differential/Platelet  Screening for colorectal cancer -     Cologuard  Vaccination declined  BMI 31.0-31.9,adult Diet and exercise encouraged. Labs pending.  -     CMP14+EGFR -     Lipid panel -     Thyroid Panel With TSH -     CBC with Differential/Platelet  Former smoker -     DG Chest 2 View; Future  Will complete surgical clearance form once labs have resulted. Pt aware.   Counseled on healthy lifestyle choices, including diet (rich in fruits, vegetables and lean meats and low in salt and simple carbohydrates) and exercise (at least 30 minutes of moderate physical activity daily).  Patient to follow up in 1 year for annual exam or sooner if needed.  The above assessment and management plan was discussed with the patient. The patient verbalized understanding of and has agreed to the management plan. Patient is aware to call the clinic if symptoms persist or worsen. Patient is aware when to return to the clinic for a follow-up visit. Patient educated on when it is appropriate to go to the emergency department.   Monia Pouch, FNP-C [facility] [address] [place], [place] [address] [phone]

## 2021-12-28 LAB — CMP14+EGFR
ALT: 27 IU/L (ref 0–44)
AST: 18 IU/L (ref 0–40)
Albumin/Globulin Ratio: 2 (ref 1.2–2.2)
Albumin: 4.7 g/dL (ref 3.9–4.9)
Alkaline Phosphatase: 85 IU/L (ref 44–121)
BUN/Creatinine Ratio: 11 (ref 10–24)
BUN: 14 mg/dL (ref 8–27)
Bilirubin Total: 0.3 mg/dL (ref 0.0–1.2)
CO2: 22 mmol/L (ref 20–29)
Calcium: 9.9 mg/dL (ref 8.6–10.2)
Chloride: 103 mmol/L (ref 96–106)
Creatinine, Ser: 1.26 mg/dL (ref 0.76–1.27)
Globulin, Total: 2.3 g/dL (ref 1.5–4.5)
Glucose: 89 mg/dL (ref 70–99)
Potassium: 4.5 mmol/L (ref 3.5–5.2)
Sodium: 139 mmol/L (ref 134–144)
Total Protein: 7 g/dL (ref 6.0–8.5)
eGFR: 65 mL/min/{1.73_m2} (ref >59)

## 2021-12-28 LAB — CBC WITH DIFFERENTIAL/PLATELET
Basophils Absolute: 0 10*3/uL (ref 0.0–0.2)
Basos: 1 %
EOS (ABSOLUTE): 0.1 10*3/uL (ref 0.0–0.4)
Eos: 2 %
Hematocrit: 44 % (ref 37.5–51.0)
Hemoglobin: 14.9 g/dL (ref 13.0–17.7)
Immature Grans (Abs): 0 10*3/uL (ref 0.0–0.1)
Immature Granulocytes: 0 %
Lymphocytes Absolute: 2.6 10*3/uL (ref 0.7–3.1)
Lymphs: 44 %
MCH: 28.8 pg (ref 26.6–33.0)
MCHC: 33.9 g/dL (ref 31.5–35.7)
MCV: 85 fL (ref 79–97)
Monocytes Absolute: 0.5 10*3/uL (ref 0.1–0.9)
Monocytes: 9 %
Neutrophils Absolute: 2.6 10*3/uL (ref 1.4–7.0)
Neutrophils: 44 %
Platelets: 284 10*3/uL (ref 150–450)
RBC: 5.17 x10E6/uL (ref 4.14–5.80)
RDW: 13.6 % (ref 11.6–15.4)
WBC: 5.9 10*3/uL (ref 3.4–10.8)

## 2021-12-28 LAB — HIV ANTIBODY (ROUTINE TESTING W REFLEX): HIV Screen 4th Generation wRfx: NONREACTIVE

## 2021-12-28 LAB — THYROID PANEL WITH TSH
Free Thyroxine Index: 2.1 (ref 1.2–4.9)
T3 Uptake Ratio: 22 % — ABNORMAL LOW (ref 24–39)
T4, Total: 9.5 ug/dL (ref 4.5–12.0)
TSH: 2.93 u[IU]/mL (ref 0.450–4.500)

## 2021-12-28 LAB — PSA, TOTAL AND FREE
PSA, Free Pct: 44.3 %
PSA, Free: 0.31 ng/mL
Prostate Specific Ag, Serum: 0.7 ng/mL (ref 0.0–4.0)

## 2021-12-28 LAB — LIPID PANEL
Chol/HDL Ratio: 5 ratio (ref 0.0–5.0)
Cholesterol, Total: 240 mg/dL — ABNORMAL HIGH (ref 100–199)
HDL: 48 mg/dL (ref >39)
LDL Chol Calc (NIH): 170 mg/dL — ABNORMAL HIGH (ref 0–99)
Triglycerides: 121 mg/dL (ref 0–149)
VLDL Cholesterol Cal: 22 mg/dL (ref 5–40)

## 2021-12-28 LAB — HEPATITIS C ANTIBODY: Hep C Virus Ab: NONREACTIVE

## 2022-01-01 NOTE — Progress Notes (Unsigned)
CT ordered. 

## 2022-01-09 NOTE — Addendum Note (Signed)
Addended by: Baruch Gouty on: 01/09/2022 11:37 AM   Modules accepted: Orders

## 2022-01-14 ENCOUNTER — Ambulatory Visit (HOSPITAL_COMMUNITY)
Admission: RE | Admit: 2022-01-14 | Discharge: 2022-01-14 | Disposition: A | Discharge status: Home / Self Care | Payer: No Typology Code available for payment source | Source: Ambulatory Visit | Attending: Family Medicine | Admitting: Family Medicine

## 2022-01-14 DIAGNOSIS — R9389 Abnormal findings on diagnostic imaging of other specified body structures: Secondary | ICD-10-CM | POA: Diagnosis present

## 2022-01-14 DIAGNOSIS — Z87891 Personal history of nicotine dependence: Secondary | ICD-10-CM | POA: Diagnosis present

## 2022-01-14 MED ORDER — IOHEXOL 300 MG/ML  SOLN
75.0000 mL | Freq: Once | INTRAMUSCULAR | Status: AC | PRN
  Administered 2022-01-14: 75 mL via INTRAVENOUS

## 2022-01-15 NOTE — Addendum Note (Signed)
Addended by: Baruch Gouty on: 01/15/2022 12:55 PM   Modules accepted: Orders

## 2022-01-22 ENCOUNTER — Ambulatory Visit (HOSPITAL_COMMUNITY)
Admission: RE | Admit: 2022-01-22 | Discharge: 2022-01-22 | Disposition: A | Discharge status: Home / Self Care | Payer: No Typology Code available for payment source | Source: Ambulatory Visit | Attending: Family Medicine | Admitting: Family Medicine

## 2022-01-22 DIAGNOSIS — E041 Nontoxic single thyroid nodule: Secondary | ICD-10-CM | POA: Diagnosis present

## 2022-01-25 LAB — COLOGUARD: COLOGUARD: NEGATIVE

## 2022-02-06 ENCOUNTER — Ambulatory Visit: Payer: Self-pay | Admitting: Orthopedic Surgery

## 2022-02-06 DIAGNOSIS — M48062 Spinal stenosis, lumbar region with neurogenic claudication: Secondary | ICD-10-CM

## 2022-02-11 ENCOUNTER — Ambulatory Visit: Payer: Self-pay | Admitting: Orthopedic Surgery

## 2022-02-11 NOTE — H&P (Signed)
[name] is an 61 y.o. male.   Chief Complaint: back and leg pain HPI: Reason for Visit: (normal) visit for: (back) Context: work injury; 14 months 11/24/21 Location (Lower Extremity): lower back pain Severity: pain level 6/10; moderate Associated Symptoms: numbness/tingling (left leg) Are you working? modified duty (sedentary) Medications: helping a little; hydrocone 7.5/325 as needed at nighttime Notes: surgery was ordered at last office visit 12/19/21 - waiting on work comp auth  Past Medical History:  Diagnosis Date   Blind left eye     Past Surgical History:  Procedure Laterality Date   EYE SURGERY      Family History  Problem Relation Age of Onset   Diabetes Sister    Alzheimer's disease Father    Social History:  reports that he has been smoking cigarettes. He has been smoking an average of 1 pack per day. He has never used smokeless tobacco. He reports that he does not drink alcohol and does not use drugs.  Allergies: No Known Allergies  Current meds: benzonatate 100 mg capsule diclofenac sodium 75 mg tablet,delayed release HYDROcodone 7.5 mg-acetaminophen 325 mg tablet ibuprofen 800 mg tablet Lagevrio 200 mg capsule (EUA) methocarbamoL 500 mg tablet methylPREDNISolone 4 mg tablets in a dose pack traMADoL 50 mg tablet  Review of Systems  Constitutional: Negative.   HENT: Negative.    Eyes: Negative.   Respiratory: Negative.    Cardiovascular: Negative.   Gastrointestinal: Negative.   Endocrine: Negative.   Genitourinary: Negative.   Musculoskeletal:  Positive for back pain and gait problem.  Neurological:  Positive for weakness and numbness.  Psychiatric/Behavioral: Negative.      There were no vitals taken for this visit. Physical Exam Constitutional:      Appearance: Normal appearance.  HENT:     Head: Normocephalic and atraumatic.     Right Ear: External ear normal.     Left Ear: External ear normal.     Nose: Nose normal.      Mouth/Throat:     Pharynx: Oropharynx is clear.  Eyes:     Conjunctiva/sclera: Conjunctivae normal.  Cardiovascular:     Rate and Rhythm: Normal rate and regular rhythm.     Pulses: Normal pulses.     Heart sounds: Normal heart sounds.  Pulmonary:     Effort: Pulmonary effort is normal.     Breath sounds: Normal breath sounds.  Abdominal:     General: Bowel sounds are normal.  Musculoskeletal:     Cervical back: Normal range of motion.     Comments: Patient has hip flexor weakness on the left as well as slight quadricep weakness. His straight leg raise is positive for buttock and posterior thigh pain positive femoral stretch.  Neurological:     Mental Status: He is alert.    MRI lumbar spine demonstrates disc herniation L2-3 migrating cranially with impingement on the right L3 nerve root. There is severe spinal stenosis noted at that level with tortuous and redundant nerve roots proximal to the level. Disc degeneration is noted at the L3-4, L4-5, L5-S1. Epidural lipomatosis L5-S1.  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 Patient reports whole leg on left we will get numb and weak.  Plan:  I discussed options with the patient including activity modification avoid extension favor flexion continue physical therapy epidural steroid injections and lumbar decompression. This has been   going on for over a year for the patient and he has declined epidural steroid injection physical therapy has not been helping him. And he remains significantly disabled. He has to sit for relief he has predominately pain that radiates down the back of the left leg but the whole left leg will get numb and he has to sit and then it goes away occasionally down the right side. He would benefit from a decompression L2-3 and and possible  microdiscectomy as well as a hemilaminotomy on the left L5-S1 and removal of portion of the epidural lipomatosis. I feel his injury is directly caused a disc herniation at L2-3 with his underlying stenosis is generating radiculopathy. 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 As well as epidural lipomatosis  I had an extensive discussion with the patient concerning the pathology relevant anatomy and treatment options. At this point exhausting conservative treatment and in the presence of a neurologic deficit we discussed microlumbar decompression. I discussed the risks and benefits including bleeding, infection, DVT, PE, anesthetic complications, worsening in their symptoms, improvement in their symptoms, C SF leakage, epidural fibrosis, need for future surgeries such as revision discectomy and lumbar fusion. I also indicated that this is an operation to basically decompress the nerve roots to allow recovery as opposed to fixing a herniated disc if it is encountered and that the incidence of recurrent chest disc herniation can approach 15%. Also that nerve root recovery is variable and may not recover completely. Any ligament or bone that is contributing to compressing the nerves will be removed as well.  I discussed the operative course including overnight in the hospital. Immediate ambulation. Follow-up in 2 weeks for suture removal. 6 weeks until healing of the herniation and surgical incision followed by 6 weeks of reconditioning and strengthening of the core musculature. Also discussed the need to employ the concepts of disc pressure management and core motion following the surgery to minimize the risk of recurrent disc herniation. We will obtain preoperative clearance i if necessary and proceed accordingly.   No history of DVT or MRSA. He is Kefzol and oxycodone. Preoperative clearance He has been doing therapy at [facility] with [name]. We will have him return for postop therapy at about week 3-4.  I discussed these findings in front of the patient with their case manager. I summarized the encounter including the patient's complaints, progress to date, and current recommendations for future treatment and follow-up. In addition, we discussed work restrictions and the patient's full duty job description.  Continue with sedentary work. Continue physical therapy for prehab   Plan laminectomy L2-3 central, L5-S1 left, microdiscectomy L2-3  Dorothy Spark, PA-C for Dr Shelle Iron 02/11/2022, 4:49 PM

## 2022-02-11 NOTE — H&P (View-Only) (Signed)
[name] is an 61 y.o. male.   Chief Complaint: back and leg pain HPI: Reason for Visit: (normal) visit for: (back) Context: work injury; 14 months 11/24/21 Location (Lower Extremity): lower back pain Severity: pain level 6/10; moderate Associated Symptoms: numbness/tingling (left leg) Are you working? modified duty (sedentary) Medications: helping a little; hydrocone 7.5/325 as needed at nighttime Notes: surgery was ordered at last office visit 12/19/21 - waiting on work comp [employer]  Past Medical History:  Diagnosis Date   Blind left eye     Past Surgical History:  Procedure Laterality Date   EYE SURGERY      Family History  Problem Relation Age of Onset   Diabetes Sister    Alzheimer's disease Father    Social History:  reports that he has been smoking cigarettes. He has been smoking an average of 1 pack per day. He has never used smokeless tobacco. He reports that he does not drink alcohol and does not use drugs.  Allergies: No Known Allergies  Current meds: benzonatate 100 mg capsule diclofenac sodium 75 mg tablet,delayed release HYDROcodone 7.5 mg-acetaminophen 325 mg tablet ibuprofen 800 mg tablet Lagevrio 200 mg capsule (EUA) methocarbamoL 500 mg tablet methylPREDNISolone 4 mg tablets in a dose pack traMADoL 50 mg tablet  Review of Systems  Constitutional: Negative.   HENT: Negative.    Eyes: Negative.   Respiratory: Negative.    Cardiovascular: Negative.   Gastrointestinal: Negative.   Endocrine: Negative.   Genitourinary: Negative.   Musculoskeletal:  Positive for back pain and gait problem.  Neurological:  Positive for weakness and numbness.  Psychiatric/Behavioral: Negative.      There were no vitals taken for this visit. Physical Exam Constitutional:      Appearance: Normal appearance.  HENT:     Head: Normocephalic and atraumatic.     Right Ear: External ear normal.     Left Ear: External ear normal.     Nose: Nose normal.      Mouth/Throat:     Pharynx: Oropharynx is clear.  Eyes:     Conjunctiva/sclera: Conjunctivae normal.  Cardiovascular:     Rate and Rhythm: Normal rate and regular rhythm.     Pulses: Normal pulses.     Heart sounds: Normal heart sounds.  Pulmonary:     Effort: Pulmonary effort is normal.     Breath sounds: Normal breath sounds.  Abdominal:     General: Bowel sounds are normal.  Musculoskeletal:     Cervical back: Normal range of motion.     Comments: Patient has hip flexor weakness on the left as well as slight quadricep weakness. His straight leg raise is positive for buttock and posterior thigh pain positive femoral stretch.  Neurological:     Mental Status: He is alert.    MRI lumbar spine demonstrates disc herniation L2-3 migrating cranially with impingement on the right L3 nerve root. There is severe spinal stenosis noted at that level with tortuous and redundant nerve roots proximal to the level. Disc degeneration is noted at the L3-4, L4-5, L5-S1. Epidural lipomatosis L5-S1.  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 Patient reports whole leg on left we will get numb and weak.  Plan:  I discussed options with the patient including activity modification avoid extension favor flexion continue physical therapy epidural steroid injections and lumbar decompression. This has been  going on for over a year for the patient and he has declined epidural steroid injection physical therapy has not been helping him. And he remains significantly disabled. He has to sit for relief he has predominately pain that radiates down the back of the left leg but the whole left leg will get numb and he has to sit and then it goes away occasionally down the right side. He would benefit from a decompression L2-3 and and possible  microdiscectomy as well as a hemilaminotomy on the left L5-S1 and removal of portion of the epidural lipomatosis. I feel his injury is directly caused a disc herniation at L2-3 with his underlying stenosis is generating radiculopathy. 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 As well as epidural lipomatosis  I had an extensive discussion with the patient concerning the pathology relevant anatomy and treatment options. At this point exhausting conservative treatment and in the presence of a neurologic deficit we discussed microlumbar decompression. I discussed the risks and benefits including bleeding, infection, DVT, PE, anesthetic complications, worsening in their symptoms, improvement in their symptoms, C SF leakage, epidural fibrosis, need for future surgeries such as revision discectomy and lumbar fusion. I also indicated that this is an operation to basically decompress the nerve roots to allow recovery as opposed to fixing a herniated disc if it is encountered and that the incidence of recurrent chest disc herniation can approach 15%. Also that nerve root recovery is variable and may not recover completely. Any ligament or bone that is contributing to compressing the nerves will be removed as well.  I discussed the operative course including overnight in the hospital. Immediate ambulation. Follow-up in 2 weeks for suture removal. 6 weeks until healing of the herniation and surgical incision followed by 6 weeks of reconditioning and strengthening of the core musculature. Also discussed the need to employ the concepts of disc pressure management and core motion following the surgery to minimize the risk of recurrent disc herniation. We will obtain preoperative clearance i if necessary and proceed accordingly.   No history of DVT or MRSA. He is Kefzol and oxycodone. Preoperative clearance He has been doing therapy at [facility] with [name]. We will have him return for postop therapy at about week 3-4.  I discussed these findings in front of the patient with their case manager. I summarized the encounter including the patient's complaints, progress to date, and current recommendations for future treatment and follow-up. In addition, we discussed work restrictions and the patient's full duty job description.  Continue with sedentary work. Continue physical therapy for prehab   Plan laminectomy L2-3 central, L5-S1 left, microdiscectomy L2-3  Dorothy Spark, PA-C for Dr Shelle Iron 02/11/2022, 4:49 PM

## 2022-02-14 NOTE — Pre-Procedure Instructions (Signed)
Surgical Instructions    Your procedure is scheduled on Thursday, February 21, 2022.  Report to [facility] Main Entrance "A" at 8:25 A.M., then check in with the Admitting office.  Call this number if you have problems the morning of surgery:  [phone]   If you have any questions prior to your surgery date call [phone]: Open Monday-Friday 8am-4pm If you experience any cold or flu symptoms such as cough, fever, chills, shortness of breath, etc. between now and your scheduled surgery, please notify us at the above number     Remember:  Do not eat after midnight the night before your surgery  You may drink clear liquids until 7:25 am the morning of your surgery.   Clear liquids allowed are: Water, Non-Citrus Juices (without pulp), Carbonated Beverages, Clear Tea, Black Coffee ONLY (NO MILK, CREAM OR POWDERED CREAMER of any kind), and Gatorade   Enhanced Recovery after Surgery for Orthopedics Enhanced Recovery after Surgery is a protocol used to improve the stress on your body and your recovery after surgery.  Patient Instructions  The day of surgery (if you do NOT have diabetes):  Drink ONE (1) Pre-Surgery Clear Ensure by  7:25 am the morning of surgery   This drink was given to you during your hospital  pre-op appointment visit. Nothing else to drink after completing the  Pre-Surgery Clear Ensure.         If you have questions, please contact your surgeon's office.     Take these medicines the morning of surgery with A SIP OF WATER: none    As of today, STOP taking any Aspirin (unless otherwise instructed by your surgeon) Aleve, Naproxen, Ibuprofen, Motrin, Advil, Goody's, BC's, all herbal medications, fish oil, and all vitamins.           Do not wear jewelry. Do not wear lotions, powder,cologne or deodorant. Do not shave 48 hours prior to surgery.  Men may shave face and neck. Do not bring valuables to the hospital. Do not wear nail polish, gel polish, artificial  nails, or any other type of covering on natural nails (fingers and toes)  [facility] is not responsible for any belongings or valuables.    Do NOT Smoke (Tobacco/Vaping)  24 hours prior to your procedure  If you use a CPAP at night, you may bring your mask for your overnight stay.   Contacts, glasses, hearing aids, dentures or partials may not be worn into surgery, please bring cases for these belongings   For patients admitted to the hospital, discharge time will be determined by your treatment team.   Patients discharged the day of surgery will not be allowed to drive home, and someone needs to stay with them for 24 hours.   SURGICAL WAITING ROOM VISITATION Patients having surgery or a procedure may have no more than 2 support people in the waiting area - these visitors may rotate.   Children under the age of 51 must have an adult with them who is not the patient. If the patient needs to stay at the hospital during part of their recovery, the visitor guidelines for inpatient rooms apply. Pre-op nurse will coordinate an appropriate time for 1 support person to accompany patient in pre-op.  This support person may not rotate.   Please refer to [email or web address] for the visitor guidelines for Inpatients (after your surgery is over and you are in a regular room).    Special instructions:    Oral Hygiene is also important to  reduce your risk of infection.  Remember - BRUSH YOUR TEETH THE MORNING OF SURGERY WITH YOUR REGULAR TOOTHPASTE   [facility]- Preparing For Surgery  Before surgery, you can play an important role. Because skin is not sterile, your skin needs to be as free of germs as possible. You can reduce the number of germs on your skin by washing with CHG (chlorahexidine gluconate) Soap before surgery.  CHG is an antiseptic cleaner which kills germs and bonds with the skin to continue killing germs even after washing.      Please do not use if you have an allergy to CHG or antibacterial soaps. If your skin becomes reddened/irritated stop using the CHG.  Do not shave (including legs and underarms) for at least 48 hours prior to first CHG shower. It is OK to shave your face.  Please follow these instructions carefully.     Shower the NIGHT BEFORE SURGERY and the MORNING OF SURGERY with CHG Soap.   If you chose to wash your hair, wash your hair first as usual with your normal shampoo. After you shampoo, rinse your hair and body thoroughly to remove the shampoo.  Then [employer] and genitals (private parts) with your normal soap and rinse thoroughly to remove soap.  After that Use CHG Soap as you would any other liquid soap. You can apply CHG directly to the skin and wash gently with a scrungie or a clean washcloth.   Apply the CHG Soap to your body ONLY FROM THE NECK DOWN.  Do not use on open wounds or open sores. Avoid contact with your eyes, ears, mouth and genitals (private parts). Wash Face and genitals (private parts)  with your normal soap.   Wash thoroughly, paying special attention to the area where your surgery will be performed.  Thoroughly rinse your body with warm water from the neck down.  DO NOT shower/wash with your normal soap after using and rinsing off the CHG Soap.  Pat yourself dry with a CLEAN TOWEL.  Wear CLEAN PAJAMAS to bed the night before surgery  Place CLEAN SHEETS on your bed the night before your surgery  DO NOT SLEEP WITH PETS.   Day of Surgery:  Take a shower with CHG soap. Wear Clean/Comfortable clothing the morning of surgery Do not apply any deodorants/lotions.   Remember to brush your teeth WITH YOUR REGULAR TOOTHPASTE.    If you received a COVID test during your pre-op visit, it is requested that you wear a mask when out in public, stay away from anyone that may not be feeling well, and notify your surgeon if you develop symptoms. If you have been in contact  with anyone that has tested positive in the last 10 days, please notify your surgeon.    Please read over the following fact sheets that you were given.

## 2022-02-15 ENCOUNTER — Encounter (HOSPITAL_COMMUNITY)
Admission: RE | Admit: 2022-02-15 | Discharge: 2022-02-15 | Disposition: A | Discharge status: Home / Self Care | Payer: No Typology Code available for payment source | Source: Ambulatory Visit | Attending: Specialist | Admitting: Specialist

## 2022-02-15 ENCOUNTER — Encounter (HOSPITAL_COMMUNITY): Payer: Self-pay

## 2022-02-15 ENCOUNTER — Other Ambulatory Visit: Payer: Self-pay

## 2022-02-15 VITALS — BP 120/83 | Temp 97.4°F | Resp 18 | Ht 72.0 in | Wt 246.7 lb

## 2022-02-15 DIAGNOSIS — Z01818 Encounter for other preprocedural examination: Secondary | ICD-10-CM

## 2022-02-15 DIAGNOSIS — Z01812 Encounter for preprocedural laboratory examination: Secondary | ICD-10-CM | POA: Insufficient documentation

## 2022-02-15 LAB — CBC
HCT: 43.1 % (ref 39.0–52.0)
Hemoglobin: 14.9 g/dL (ref 13.0–17.0)
MCH: 30 pg (ref 26.0–34.0)
MCHC: 34.6 g/dL (ref 30.0–36.0)
MCV: 86.7 fL (ref 80.0–100.0)
Platelets: 269 10*3/uL (ref 150–400)
RBC: 4.97 MIL/uL (ref 4.22–5.81)
RDW: 13.5 % (ref 11.5–15.5)
WBC: 6.8 10*3/uL (ref 4.0–10.5)
nRBC: 0 % (ref 0.0–0.2)

## 2022-02-15 LAB — SURGICAL PCR SCREEN
MRSA, PCR: NEGATIVE
Staphylococcus aureus: NEGATIVE

## 2022-02-15 NOTE — Progress Notes (Signed)
PCP - Gilford Silvius, NP Cardiologist - pt denies  PPM/ICD - pt denies Device Orders - n/a Rep Notified - n/a  Chest x-ray - n/a EKG - 12/27/21 Stress Test - pt denies ECHO - pt denies Cardiac Cath - pt denies  Sleep Study - pt denies  CPAP - n/a  Fasting Blood Sugar - pt denies Checks Blood Sugar _____ times a day  Last dose of GLP1 agonist-  pt denies GLP1 instructions: n/a  Blood Thinner Instructions:pt denies Aspirin Instructions:pt denies  ERAS Protcol -yes PRE-SURGERY Ensure given at PAT visit today.  COVID TEST- n/a   Anesthesia review: NO  Patient denies shortness of breath, fever, cough and chest pain at PAT appointment   All instructions explained to the patient, with a verbal understanding of the material. Patient agrees to go over the instructions while at home for a better understanding. Patient also instructed to self quarantine after being tested for COVID-19. The opportunity to ask questions was provided.

## 2022-02-21 ENCOUNTER — Encounter (HOSPITAL_COMMUNITY): Payer: Self-pay | Admitting: Specialist

## 2022-02-21 ENCOUNTER — Other Ambulatory Visit: Payer: Self-pay

## 2022-02-21 ENCOUNTER — Ambulatory Visit (HOSPITAL_COMMUNITY): Payer: 59

## 2022-02-21 ENCOUNTER — Ambulatory Visit (HOSPITAL_BASED_OUTPATIENT_CLINIC_OR_DEPARTMENT_OTHER): Payer: Worker's Compensation | Admitting: Anesthesiology

## 2022-02-21 ENCOUNTER — Ambulatory Visit (HOSPITAL_COMMUNITY): Payer: Worker's Compensation | Admitting: Anesthesiology

## 2022-02-21 ENCOUNTER — Encounter (HOSPITAL_COMMUNITY)
Admission: RE | Disposition: A | Discharge status: Home / Self Care | Payer: Self-pay | Source: Home / Self Care | Attending: Specialist

## 2022-02-21 ENCOUNTER — Ambulatory Visit (HOSPITAL_COMMUNITY)
Admission: RE | Admit: 2022-02-21 | Discharge: 2022-02-22 | Disposition: A | Discharge status: Home / Self Care | Payer: Worker's Compensation | Attending: Specialist | Admitting: Specialist

## 2022-02-21 DIAGNOSIS — M48062 Spinal stenosis, lumbar region with neurogenic claudication: Secondary | ICD-10-CM | POA: Insufficient documentation

## 2022-02-21 DIAGNOSIS — M4726 Other spondylosis with radiculopathy, lumbar region: Secondary | ICD-10-CM | POA: Insufficient documentation

## 2022-02-21 DIAGNOSIS — M5126 Other intervertebral disc displacement, lumbar region: Secondary | ICD-10-CM

## 2022-02-21 DIAGNOSIS — M5116 Intervertebral disc disorders with radiculopathy, lumbar region: Secondary | ICD-10-CM | POA: Insufficient documentation

## 2022-02-21 DIAGNOSIS — M48061 Spinal stenosis, lumbar region without neurogenic claudication: Secondary | ICD-10-CM | POA: Diagnosis present

## 2022-02-21 DIAGNOSIS — Z87891 Personal history of nicotine dependence: Secondary | ICD-10-CM | POA: Diagnosis not present

## 2022-02-21 DIAGNOSIS — M4807 Spinal stenosis, lumbosacral region: Secondary | ICD-10-CM

## 2022-02-21 DIAGNOSIS — E882 Lipomatosis, not elsewhere classified: Secondary | ICD-10-CM | POA: Insufficient documentation

## 2022-02-21 DIAGNOSIS — M5416 Radiculopathy, lumbar region: Secondary | ICD-10-CM | POA: Diagnosis present

## 2022-02-21 HISTORY — PX: LUMBAR LAMINECTOMY/DECOMPRESSION MICRODISCECTOMY: SHX5026

## 2022-02-21 SURGERY — LUMBAR LAMINECTOMY/DECOMPRESSION MICRODISCECTOMY 1 LEVEL
Anesthesia: General | Site: Spine Lumbar

## 2022-02-21 MED ORDER — CELECOXIB 200 MG PO CAPS
200.0000 mg | ORAL_CAPSULE | Freq: Once | ORAL | Status: AC
  Administered 2022-02-21: 200 mg via ORAL
  Filled 2022-02-21: qty 1

## 2022-02-21 MED ORDER — ACETAMINOPHEN 650 MG RE SUPP: 650.0000 mg | RECTAL | Status: DC | PRN

## 2022-02-21 MED ORDER — 0.9 % SODIUM CHLORIDE (POUR BTL) OPTIME
TOPICAL | Status: DC | PRN
  Administered 2022-02-21: 1000 mL

## 2022-02-21 MED ORDER — ACETAMINOPHEN 325 MG PO TABS: 650.0000 mg | ORAL_TABLET | ORAL | Status: DC | PRN

## 2022-02-21 MED ORDER — FENTANYL CITRATE (PF) 100 MCG/2ML IJ SOLN
25.0000 ug | INTRAMUSCULAR | Status: DC | PRN
  Administered 2022-02-21 (×3): 50 ug via INTRAVENOUS

## 2022-02-21 MED ORDER — AMISULPRIDE (ANTIEMETIC) 5 MG/2ML IV SOLN: 10.0000 mg | Freq: Once | INTRAVENOUS | Status: DC | PRN

## 2022-02-21 MED ORDER — DOCUSATE SODIUM 100 MG PO CAPS
100.0000 mg | ORAL_CAPSULE | Freq: Two times a day (BID) | ORAL | Status: DC
  Administered 2022-02-21 – 2022-02-22 (×2): 100 mg via ORAL
  Filled 2022-02-21 (×2): qty 1

## 2022-02-21 MED ORDER — THROMBIN 20000 UNITS EX SOLR: CUTANEOUS | Status: DC | PRN

## 2022-02-21 MED ORDER — OXYCODONE HCL 5 MG PO TABS: 5.0000 mg | ORAL_TABLET | ORAL | Status: DC | PRN

## 2022-02-21 MED ORDER — ROCURONIUM BROMIDE 10 MG/ML (PF) SYRINGE
PREFILLED_SYRINGE | INTRAVENOUS | Status: AC
  Filled 2022-02-21: qty 10

## 2022-02-21 MED ORDER — POLYETHYLENE GLYCOL 3350 17 G PO PACK: 17.0000 g | PACK | Freq: Every day | ORAL | 0 refills | Status: DC

## 2022-02-21 MED ORDER — HYDROMORPHONE HCL 1 MG/ML IJ SOLN: 0.5000 mg | INTRAMUSCULAR | Status: DC | PRN

## 2022-02-21 MED ORDER — ONDANSETRON HCL 4 MG/2ML IJ SOLN
INTRAMUSCULAR | Status: AC
  Filled 2022-02-21: qty 2

## 2022-02-21 MED ORDER — FENTANYL CITRATE (PF) 100 MCG/2ML IJ SOLN
INTRAMUSCULAR | Status: AC
  Filled 2022-02-21: qty 2

## 2022-02-21 MED ORDER — DEXAMETHASONE SODIUM PHOSPHATE 10 MG/ML IJ SOLN
INTRAMUSCULAR | Status: DC | PRN
  Administered 2022-02-21: 10 mg via INTRAVENOUS

## 2022-02-21 MED ORDER — BISACODYL 5 MG PO TBEC: 5.0000 mg | DELAYED_RELEASE_TABLET | Freq: Every day | ORAL | Status: DC | PRN

## 2022-02-21 MED ORDER — PHENYLEPHRINE HCL-NACL 20-0.9 MG/250ML-% IV SOLN
INTRAVENOUS | Status: DC | PRN
  Administered 2022-02-21: 25 ug/min via INTRAVENOUS

## 2022-02-21 MED ORDER — CHLORHEXIDINE GLUCONATE 0.12 % MT SOLN
15.0000 mL | Freq: Once | OROMUCOSAL | Status: AC
  Administered 2022-02-21: 15 mL via OROMUCOSAL
  Filled 2022-02-21: qty 15

## 2022-02-21 MED ORDER — DEXAMETHASONE SODIUM PHOSPHATE 10 MG/ML IJ SOLN
INTRAMUSCULAR | Status: AC
  Filled 2022-02-21: qty 1

## 2022-02-21 MED ORDER — METHOCARBAMOL 1000 MG/10ML IJ SOLN: 500.0000 mg | Freq: Four times a day (QID) | INTRAVENOUS | Status: DC | PRN

## 2022-02-21 MED ORDER — CEFAZOLIN SODIUM-DEXTROSE 2-4 GM/100ML-% IV SOLN
2.0000 g | INTRAVENOUS | Status: AC
  Administered 2022-02-21: 2 g via INTRAVENOUS
  Filled 2022-02-21: qty 100

## 2022-02-21 MED ORDER — MIDAZOLAM HCL 2 MG/2ML IJ SOLN
INTRAMUSCULAR | Status: AC
  Filled 2022-02-21: qty 2

## 2022-02-21 MED ORDER — DOCUSATE SODIUM 100 MG PO CAPS: 100.0000 mg | ORAL_CAPSULE | Freq: Two times a day (BID) | ORAL | 1 refills | Status: DC | PRN

## 2022-02-21 MED ORDER — MIDAZOLAM HCL 2 MG/2ML IJ SOLN
INTRAMUSCULAR | Status: DC | PRN
  Administered 2022-02-21: 2 mg via INTRAVENOUS

## 2022-02-21 MED ORDER — METHOCARBAMOL 500 MG PO TABS
500.0000 mg | ORAL_TABLET | Freq: Four times a day (QID) | ORAL | Status: DC | PRN
  Administered 2022-02-21 – 2022-02-22 (×2): 500 mg via ORAL
  Filled 2022-02-21 (×2): qty 1

## 2022-02-21 MED ORDER — THROMBIN 20000 UNITS EX SOLR
CUTANEOUS | Status: AC
  Filled 2022-02-21: qty 20000

## 2022-02-21 MED ORDER — SUGAMMADEX SODIUM 200 MG/2ML IV SOLN
INTRAVENOUS | Status: DC | PRN
  Administered 2022-02-21: 300 mg via INTRAVENOUS

## 2022-02-21 MED ORDER — KCL IN DEXTROSE-NACL 20-5-0.45 MEQ/L-%-% IV SOLN
INTRAVENOUS | Status: DC
  Filled 2022-02-21: qty 1000

## 2022-02-21 MED ORDER — ONDANSETRON HCL 4 MG PO TABS: 4.0000 mg | ORAL_TABLET | Freq: Four times a day (QID) | ORAL | Status: DC | PRN

## 2022-02-21 MED ORDER — LIDOCAINE 2% (20 MG/ML) 5 ML SYRINGE
INTRAMUSCULAR | Status: DC | PRN
  Administered 2022-02-21: 100 mg via INTRAVENOUS

## 2022-02-21 MED ORDER — MAGNESIUM CITRATE PO SOLN: 1.0000 | Freq: Once | ORAL | Status: DC | PRN

## 2022-02-21 MED ORDER — PHENYLEPHRINE HCL (PRESSORS) 10 MG/ML IV SOLN
INTRAVENOUS | Status: AC
  Filled 2022-02-21: qty 1

## 2022-02-21 MED ORDER — EPHEDRINE 5 MG/ML INJ
INTRAVENOUS | Status: AC
  Filled 2022-02-21: qty 5

## 2022-02-21 MED ORDER — POLYETHYLENE GLYCOL 3350 17 G PO PACK: 17.0000 g | PACK | Freq: Every day | ORAL | Status: DC | PRN

## 2022-02-21 MED ORDER — OXYCODONE HCL 5 MG PO TABS
ORAL_TABLET | ORAL | Status: AC
  Filled 2022-02-21: qty 2

## 2022-02-21 MED ORDER — ONDANSETRON HCL 4 MG/2ML IJ SOLN: 4.0000 mg | Freq: Four times a day (QID) | INTRAMUSCULAR | Status: DC | PRN

## 2022-02-21 MED ORDER — FENTANYL CITRATE (PF) 250 MCG/5ML IJ SOLN
INTRAMUSCULAR | Status: AC
  Filled 2022-02-21: qty 5

## 2022-02-21 MED ORDER — ROCURONIUM BROMIDE 10 MG/ML (PF) SYRINGE
PREFILLED_SYRINGE | INTRAVENOUS | Status: DC | PRN
  Administered 2022-02-21: 10 mg via INTRAVENOUS
  Administered 2022-02-21: 100 mg via INTRAVENOUS

## 2022-02-21 MED ORDER — PROMETHAZINE HCL 25 MG/ML IJ SOLN: 6.2500 mg | INTRAMUSCULAR | Status: DC | PRN

## 2022-02-21 MED ORDER — ONDANSETRON HCL 4 MG/2ML IJ SOLN
INTRAMUSCULAR | Status: DC | PRN
  Administered 2022-02-21: 4 mg via INTRAVENOUS

## 2022-02-21 MED ORDER — OXYCODONE HCL 5 MG PO TABS
10.0000 mg | ORAL_TABLET | ORAL | Status: DC | PRN
  Administered 2022-02-21 – 2022-02-22 (×4): 10 mg via ORAL
  Filled 2022-02-21 (×3): qty 2

## 2022-02-21 MED ORDER — ORAL CARE MOUTH RINSE: 15.0000 mL | Freq: Once | OROMUCOSAL | Status: AC

## 2022-02-21 MED ORDER — ALUM & MAG HYDROXIDE-SIMETH 200-200-20 MG/5ML PO SUSP: 30.0000 mL | Freq: Four times a day (QID) | ORAL | Status: DC | PRN

## 2022-02-21 MED ORDER — PHENYLEPHRINE 80 MCG/ML (10ML) SYRINGE FOR IV PUSH (FOR BLOOD PRESSURE SUPPORT)
PREFILLED_SYRINGE | INTRAVENOUS | Status: AC
  Filled 2022-02-21: qty 20

## 2022-02-21 MED ORDER — PHENYLEPHRINE 80 MCG/ML (10ML) SYRINGE FOR IV PUSH (FOR BLOOD PRESSURE SUPPORT)
PREFILLED_SYRINGE | INTRAVENOUS | Status: DC | PRN
  Administered 2022-02-21 (×2): 80 ug via INTRAVENOUS
  Administered 2022-02-21: 120 ug via INTRAVENOUS

## 2022-02-21 MED ORDER — OXYCODONE HCL 5 MG PO TABS: 5.0000 mg | ORAL_TABLET | ORAL | 0 refills | Status: DC | PRN

## 2022-02-21 MED ORDER — CEFAZOLIN SODIUM-DEXTROSE 2-4 GM/100ML-% IV SOLN
2.0000 g | Freq: Three times a day (TID) | INTRAVENOUS | Status: AC
  Administered 2022-02-21: 2 g via INTRAVENOUS
  Filled 2022-02-21: qty 100

## 2022-02-21 MED ORDER — RISAQUAD PO CAPS
1.0000 | ORAL_CAPSULE | Freq: Every day | ORAL | Status: DC
  Administered 2022-02-21 – 2022-02-22 (×2): 1 via ORAL
  Filled 2022-02-21 (×2): qty 1

## 2022-02-21 MED ORDER — TRANEXAMIC ACID-NACL 1000-0.7 MG/100ML-% IV SOLN
1000.0000 mg | INTRAVENOUS | Status: AC
  Administered 2022-02-21: 1000 mg via INTRAVENOUS
  Filled 2022-02-21: qty 100

## 2022-02-21 MED ORDER — ACETAMINOPHEN 10 MG/ML IV SOLN
1000.0000 mg | INTRAVENOUS | Status: AC
  Administered 2022-02-21: 1000 mg via INTRAVENOUS
  Filled 2022-02-21: qty 100

## 2022-02-21 MED ORDER — BUPIVACAINE-EPINEPHRINE (PF) 0.5% -1:200000 IJ SOLN
INTRAMUSCULAR | Status: AC
  Filled 2022-02-21: qty 30

## 2022-02-21 MED ORDER — ACETAMINOPHEN 500 MG PO TABS
1000.0000 mg | ORAL_TABLET | Freq: Four times a day (QID) | ORAL | Status: DC
  Administered 2022-02-21 – 2022-02-22 (×3): 1000 mg via ORAL
  Filled 2022-02-21 (×3): qty 2

## 2022-02-21 MED ORDER — LACTATED RINGERS IV SOLN: INTRAVENOUS | Status: DC

## 2022-02-21 MED ORDER — BUPIVACAINE-EPINEPHRINE 0.5% -1:200000 IJ SOLN
INTRAMUSCULAR | Status: DC | PRN
  Administered 2022-02-21: 8 mL

## 2022-02-21 MED ORDER — MENTHOL 3 MG MT LOZG: 1.0000 | LOZENGE | OROMUCOSAL | Status: DC | PRN

## 2022-02-21 MED ORDER — ACETAMINOPHEN 500 MG PO TABS: 1000.0000 mg | ORAL_TABLET | Freq: Once | ORAL | Status: DC

## 2022-02-21 MED ORDER — PROPOFOL 10 MG/ML IV BOLUS
INTRAVENOUS | Status: DC | PRN
  Administered 2022-02-21: 200 mg via INTRAVENOUS

## 2022-02-21 MED ORDER — PHENOL 1.4 % MT LIQD: 1.0000 | OROMUCOSAL | Status: DC | PRN

## 2022-02-21 MED ORDER — LIDOCAINE 2% (20 MG/ML) 5 ML SYRINGE
INTRAMUSCULAR | Status: AC
  Filled 2022-02-21: qty 5

## 2022-02-21 MED ORDER — FENTANYL CITRATE (PF) 250 MCG/5ML IJ SOLN
INTRAMUSCULAR | Status: DC | PRN
  Administered 2022-02-21: 150 ug via INTRAVENOUS
  Administered 2022-02-21 (×2): 50 ug via INTRAVENOUS

## 2022-02-21 SURGICAL SUPPLY — 61 items
BAG COUNTER SPONGE SURGICOUNT (BAG) ×1 IMPLANT
BAG DECANTER FOR FLEXI CONT (MISCELLANEOUS) IMPLANT
BAG SPNG CNTER NS LX DISP (BAG) ×1
BAND INSRT 18 STRL LF DISP RB (MISCELLANEOUS) ×2
BAND RUBBER #18 3X1/16 STRL (MISCELLANEOUS) ×2 IMPLANT
BUR DRUM 4.0 (BURR) IMPLANT
BUR EGG ELITE 5.0 (BURR) IMPLANT
BUR RND DIAMOND ELITE 4.0 (BURR) IMPLANT
CLEANER TIP ELECTROSURG 2X2 (MISCELLANEOUS) ×1 IMPLANT
CNTNR URN SCR LID CUP LEK RST (MISCELLANEOUS) ×1 IMPLANT
CONT SPEC 4OZ STRL OR WHT (MISCELLANEOUS) ×1
DRAPE LAPAROTOMY 100X72X124 (DRAPES) ×1 IMPLANT
DRAPE MICROSCOPE SLANT 54X150 (MISCELLANEOUS) ×1 IMPLANT
DRAPE SHEET LG 3/4 BI-LAMINATE (DRAPES) ×1 IMPLANT
DRAPE SURG 17X11 SM STRL (DRAPES) ×1 IMPLANT
DRAPE UTILITY XL STRL (DRAPES) ×1 IMPLANT
DRESSING AQUACEL AG SP 3.5X10 (GAUZE/BANDAGES/DRESSINGS) IMPLANT
DRSG AQUACEL AG ADV 3.5X 4 (GAUZE/BANDAGES/DRESSINGS) IMPLANT
DRSG AQUACEL AG ADV 3.5X 6 (GAUZE/BANDAGES/DRESSINGS) IMPLANT
DRSG AQUACEL AG SP 3.5X10 (GAUZE/BANDAGES/DRESSINGS) ×1
DRSG TELFA 3X8 NADH STRL (GAUZE/BANDAGES/DRESSINGS) IMPLANT
DURAPREP 26ML APPLICATOR (WOUND CARE) ×1 IMPLANT
DURASEAL SPINE SEALANT 3ML (MISCELLANEOUS) IMPLANT
ELECT BLADE 4.0 EZ CLEAN MEGAD (MISCELLANEOUS)
ELECT REM PT RETURN 9FT ADLT (ELECTROSURGICAL) ×1
ELECTRODE BLDE 4.0 EZ CLN MEGD (MISCELLANEOUS) IMPLANT
ELECTRODE REM PT RTRN 9FT ADLT (ELECTROSURGICAL) ×1 IMPLANT
GLOVE BIOGEL PI IND STRL 7.5 (GLOVE) ×1 IMPLANT
GLOVE SURG SS PI 7.0 STRL IVOR (GLOVE) ×1 IMPLANT
GLOVE SURG SS PI 8.0 STRL IVOR (GLOVE) ×2 IMPLANT
GOWN STRL REUS W/ TWL LRG LVL3 (GOWN DISPOSABLE) ×1 IMPLANT
GOWN STRL REUS W/ TWL XL LVL3 (GOWN DISPOSABLE) ×1 IMPLANT
GOWN STRL REUS W/TWL LRG LVL3 (GOWN DISPOSABLE) ×1
GOWN STRL REUS W/TWL XL LVL3 (GOWN DISPOSABLE) ×1
IV CATH 14GX2 1/4 (CATHETERS) ×1 IMPLANT
KIT BASIN OR (CUSTOM PROCEDURE TRAY) ×1 IMPLANT
NDL 22X1.5 STRL (OR ONLY) (MISCELLANEOUS) ×1 IMPLANT
NDL SPNL 18GX3.5 QUINCKE PK (NEEDLE) ×2 IMPLANT
NEEDLE 22X1.5 STRL (OR ONLY) (MISCELLANEOUS) ×1 IMPLANT
NEEDLE SPNL 18GX3.5 QUINCKE PK (NEEDLE) ×2 IMPLANT
PACK LAMINECTOMY NEURO (CUSTOM PROCEDURE TRAY) ×1 IMPLANT
PATTIES SURGICAL .75X.75 (GAUZE/BANDAGES/DRESSINGS) ×1 IMPLANT
SOLUTION PRONTOSAN WOUND 350ML (IRRIGATION / IRRIGATOR) IMPLANT
SPONGE SURGIFOAM ABS GEL 100 (HEMOSTASIS) ×1 IMPLANT
SPONGE T-LAP 4X18 ~~LOC~~+RFID (SPONGE) IMPLANT
STAPLER VISISTAT (STAPLE) IMPLANT
STRIP CLOSURE SKIN 1/2X4 (GAUZE/BANDAGES/DRESSINGS) ×1 IMPLANT
SUT NURALON 4 0 TR CR/8 (SUTURE) IMPLANT
SUT PROLENE 3 0 PS 2 (SUTURE) IMPLANT
SUT VIC AB 1 CT1 27 (SUTURE) ×2
SUT VIC AB 1 CT1 27XBRD ANTBC (SUTURE) IMPLANT
SUT VIC AB 1-0 CT2 27 (SUTURE) IMPLANT
SUT VIC AB 2-0 CT1 27 (SUTURE)
SUT VIC AB 2-0 CT1 TAPERPNT 27 (SUTURE) IMPLANT
SUT VIC AB 2-0 CT2 27 (SUTURE) IMPLANT
SYR 3ML LL SCALE MARK (SYRINGE) ×1 IMPLANT
TOWEL GREEN STERILE (TOWEL DISPOSABLE) ×1 IMPLANT
TOWEL GREEN STERILE FF (TOWEL DISPOSABLE) ×1 IMPLANT
TRAY FOLEY MTR SLVR 16FR STAT (SET/KITS/TRAYS/PACK) ×1 IMPLANT
WIPE CHG 2% 2PK PREOPERATIVE (MISCELLANEOUS) ×1 IMPLANT
YANKAUER SUCT BULB TIP NO VENT (SUCTIONS) ×1 IMPLANT

## 2022-02-21 NOTE — Interval H&P Note (Signed)
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  The various methods of treatment have been discussed with the patient and family. After consideration of risks, benefits and other options for treatment, the patient has consented to  Procedure(s) with comments: Laminectomy L2-3 Central and L5-S1 left, microdiscectomy L2-3 (N/A) - 2 hrs 3 C-Bed as a surgical intervention.  The patient's history has been reviewed, patient examined, no change in status, stable for surgery.  I have reviewed the patient's chart and labs.  Questions were answered to the patient's satisfaction.     Javier Docker

## 2022-02-21 NOTE — Interval H&P Note (Signed)
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  The various methods of treatment have been discussed with the patient and family. After consideration of risks, benefits and other options for treatment, the patient has consented to  Procedure(s) with comments: Laminectomy L2-3 Central and L5-S1 left, microdiscectomy L2-3 (N/A) - 2 hrs 3 C-Bed as a surgical intervention.  The patient's history has been reviewed, patient examined, no change in status, stable for surgery.  I have reviewed the patient's chart and labs.  Questions were answered to the patient's satisfaction.     Javier Docker

## 2022-02-21 NOTE — Discharge Instructions (Signed)

## 2022-02-21 NOTE — Transfer of Care (Signed)
Immediate Anesthesia Transfer of Care Note  Patient: [name]  Procedure(s) Performed: Laminectomy L2-3 Central and L5-S1 left, microdiscectomy L2-3  Patient Location: PACU  Anesthesia Type:General  Level of Consciousness: drowsy  Airway & Oxygen Therapy: Patient Spontanous Breathing and Patient connected to nasal cannula oxygen  Post-op Assessment: Report given to RN, Post -op Vital signs reviewed and stable, and Patient moving all extremities  Post vital signs: Reviewed and stable  Last Vitals:  Vitals Value Taken Time  BP 119/91 02/21/22 1400  Temp    Pulse 80 02/21/22 1402  Resp 20 02/21/22 1402  SpO2 97 % 02/21/22 1402  Vitals shown include unvalidated device data.  Last Pain:  Vitals:   02/21/22 0834  TempSrc:   PainSc: 0-No pain      Patients Stated Pain Goal: 0 (02/21/22 0834)  Complications: No notable events documented.

## 2022-02-21 NOTE — Anesthesia Preprocedure Evaluation (Addendum)
Anesthesia Evaluation  Patient identified by MRN, date of birth, ID band Patient awake    Reviewed: Allergy & Precautions, NPO status , Patient's Chart, lab work & pertinent test results  History of Anesthesia Complications Negative for: history of anesthetic complications  Airway Mallampati: II  TM Distance: >3 FB Neck ROM: Full    Dental no notable dental hx. (+) Dental Advisory Given   Pulmonary former smoker   Pulmonary exam normal        Cardiovascular negative cardio ROS Normal cardiovascular exam     Neuro/Psych negative neurological ROS     GI/Hepatic negative GI ROS, Neg liver ROS,,,  Endo/Other  negative endocrine ROS    Renal/GU negative Renal ROS     Musculoskeletal negative musculoskeletal ROS (+)    Abdominal   Peds  Hematology negative hematology ROS (+)   Anesthesia Other Findings   Reproductive/Obstetrics                             Anesthesia Physical Anesthesia Plan  ASA: 2  Anesthesia Plan: General   Post-op Pain Management: Celebrex PO (pre-op)* and Ofirmev IV (intra-op)*   Induction: Intravenous  PONV Risk Score and Plan: 3 and Ondansetron, Dexamethasone and Midazolam  Airway Management Planned: Oral ETT  Additional Equipment:   Intra-op Plan:   Post-operative Plan: Extubation in OR  Informed Consent: I have reviewed the patients History and Physical, chart, labs and discussed the procedure including the risks, benefits and alternatives for the proposed anesthesia with the patient or authorized representative who has indicated his/her understanding and acceptance.     Dental advisory given  Plan Discussed with: Anesthesiologist and CRNA  Anesthesia Plan Comments:        Anesthesia Quick Evaluation

## 2022-02-21 NOTE — Op Note (Signed)
**Note [name]** [name], [name] MEDICAL RECORD NO: [id number] ACCOUNT NO: [id number] DATE OF BIRTH: November 14, 1960 FACILITY: [facility] LOCATION: [facility] PHYSICIAN: Javier Docker, MD  Operative Report   DATE OF PROCEDURE: 02/21/2022  PREOPERATIVE DIAGNOSES: 1.  Spinal stenosis, HNP, L2-L3. 2.  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  POSTOPERATIVE DIAGNOSES: 1.  Spinal stenosis, HNP, L2-L3. 2.  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  PROCEDURE PERFORMED:   1.  Central decompression with bilateral lateral recess decompression at L2-L3 with microdiskectomy L2-L3, left. 2.  Hemilaminotomy left L5-S1. 3. Excision of epidural lipomatosis, L5-S1, left.  ANESTHESIA:  General.  ASSISTANT:  Andrez Grime, PA.  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  The patient also had  posterior thigh pain on the left and extensive epidural lipomatosis at L5-S1,  compression of the S1 nerve root on the left.  We discussed lumbar decompression L2-L3, L5-S1, left.  Risks and benefits discussed including bleeding, infection, damage to  neurovascular structures, no change in symptoms, worsening symptoms, DVT, PE, anesthetic complications, etc.  DESCRIPTION OF PROCEDURE:  The patient in supine position, after induction of adequate general anesthesia, 2 grams Kefzol placed prone on the Wilson frame.  All bony prominences well padded.  Lumbar region was prepped and draped in the usual sterile  fashion.  18-gauge spinal needle was utilized to localize the L2-L3 and L5-S1 interspaces.  Incision was made from the spinous process of above 2 to just to the spinous process of 3.  Subcutaneous tissue was dissected.  Electrocautery was utilized to  achieve hemostasis.  Dorsal lumbar fascia divided in line with skin  incision.  Paraspinous muscle elevated from lamina L2-L3 bilaterally.  McCulloch retractor was placed.  Kochers were placed in spinous processes of L2 and L3.  Confirmatory radiograph  was obtained.  Leksell rongeur was utilized to remove the spinous process of L2.  High-speed bur was utilized to perform a partial laminotomy centrally at L2.  This was further completed with a 3 mm and 2 mm Kerrison cephalad to the point detaching the  ligamentum flavum.  A straight curette utilized detach ligamentum flavum from the cephalad edge of 3.  Centrally, we began to remove the ligamentum flavum from the interspace by entering the epidural space with a Penfield identifying epidural fat placing  in a Woodson between the ligamentum flavum and the epidural region.  I then sequentially began removing ligamentum flavum centrally and then viable laterally using a Woodson retractor to protect the neural elements as well as neural patty.  There was  hypertrophic ligamentum flavum and severe spinal stenosis noted bilaterally.  This was excised.  Then gently mobilized the L3 nerve root medially, I decompressed the lateral recess to the medial border pedicle with 2 mm Kerrison. Epidural venous plexus  was noted and cauterized here.  Identifying the 3 root, mobilizing it medially I identified a focal HNP, L2-L3. I performed an annulotomy and disk material was removed from the disk space as well as sub-posterior longitudinal ligament cephalad.  This was  mobilized with a nerve hook and retrieved with a pituitary.  Multiple fragments were excised. Then within the disk space as well as the disk space was irrigated with catheter irrigation.  Then, additional fragments were removed from the interspace and a  confirmatory radiograph was obtained at the interspace of L2-L3. In a similar  fashion, we decompressed the lateral recess and medial border of the pedicle on the right side.  After protecting the neural elements.  A small  foraminotomy was performed of  L3 bilaterally.  Bone wax was placed on the cancellous surfaces.  Woodson probe passed freely out the foramen of 2 and 3 bilaterally.  Bipolar cautery was utilized to achieve hemostasis.  Good restoration of the thecal sac.  I checked beneath thecal sac  the shoulder of root, the axilla of the root and both foramen no residual disk herniation noted.  There was 1 cm of excursion of 3 root medial pedicle without tension.  After copious irrigation and thrombin-soaked Gelfoam was placed in the laminotomy  defect and then excised.  I removed the McCulloch retractor, irrigated the paraspinous musculature.  Minor active bleeding was cauterized with bipolar.  No evidence of CSF leakage or active bleeding.  I then closed the dorsal lumbar fascia with #1 Vicryl  in interrupted figure-of-eight sutures, subcutaneous with 2-0 and skin with Prolene.  Attention was turned down to L5-S1, left.  Incision was made from the spinous process, L5-S1.  Subcutaneous tissue was dissected.  Electrocautery was utilized to  achieve hemostasis.  Dorsal lumbar fascia divided in line with skin incision.  Paraspinous muscle elevated from lamina of L5 and S1.  McCulloch retractor was placed.  Operating microscope was draped and brought in the surgical field.  A straight curette  utilized to detach ligamentum flavum from the cephalad edge of S1.  Hemilaminotomy of the caudad edge of L5 was performed as well.  I then detached the ligamentum flavum from the cephalad edge of S1.  Woodson probe passed beneath the ligamentum flavum to  protect the neural elements. I removed ligamentum flavum from the interspace.  Exuberant epidural lipomatosis was noted. I made multiple sweeps with a Woodson producing and excising epidural fat from the caudad cephalad and medially.  I removed  ligamentum flavum to the midpoint.  Good restoration of the thecal sac and after excision of multiple areas of epidural fat, which looked  normal, bipolar cautery was utilized to achieve hemostasis.  Woodson probe passed freely out the foramen at S1 and  5.  No disk herniation was noted.  Copiously irrigated with irrigation.  No evidence of CSF leakage or active bleeding.  I then removed the [facility] retractor after a confirmatory radiograph obtained with instruments at the interspace.  Irrigated the  paraspinous musculature.  Any active bleeding was cauterized.  I then closed the dorsal lumbar fascia with #1 Vicryl in interrupted figure-of-eight sutures, subcutaneous with 2-0 and skin with Prolene.  The patient was dry at closure.  Sterile dressings  were applied.  He was placed supine on the hospital bed, extubated without difficulty and transported to the recovery room in satisfactory condition.  The patient tolerated the procedure well.  No complications.  ASSISTANT:  Andrez Grime, PA.  BLOOD LOSS:  75 mL.   PUS D: 02/21/2022 1:48:13 pm T: 02/21/2022 2:21:00 pm  JOB: [id number]/ [id number]

## 2022-02-21 NOTE — Brief Op Note (Signed)
02/21/2022  10:18 AM  PATIENT:  [name]  61 y.o. male  PRE-OPERATIVE DIAGNOSIS:  Stenosis L2-3 and L5-S1  POST-OPERATIVE DIAGNOSIS:  * No post-op diagnosis entered *  PROCEDURE:  Procedure(s) with comments: Laminectomy L2-3 Central and L5-S1 left, microdiscectomy L2-3 (N/A) - 2 hrs 3 C-Bed  SURGEON:  Surgeon(s) and Role:    Jene Every, MD - Primary  PHYSICIAN ASSISTANT:   ASSISTANTS: Bissell   ANESTHESIA:   general  EBL:  [id number]  BLOOD ADMINISTERED:none  DRAINS: none   LOCAL MEDICATIONS USED:  MARCAINE     SPECIMEN:  No Specimen  DISPOSITION OF SPECIMEN:  N/A  COUNTS:  YES  TOURNIQUET:  * No tourniquets in log *  DICTATION: .Other Dictation: Dictation Number [id number]     PLAN OF CARE: Admit for overnight observation  PATIENT DISPOSITION:  PACU - hemodynamically stable.   Delay start of Pharmacological VTE agent (>24hrs) due to surgical blood loss or risk of bleeding: yes

## 2022-02-21 NOTE — Anesthesia Procedure Notes (Signed)
Procedure Name: Intubation Date/Time: 02/21/2022 11:00 AM  Performed by: Leonor Liv, CRNAPre-anesthesia Checklist: Patient identified, Emergency Drugs available, Suction available and Patient being monitored Patient Re-evaluated:Patient Re-evaluated prior to induction Oxygen Delivery Method: Circle System Utilized Preoxygenation: Pre-oxygenation with 100% oxygen Induction Type: IV induction Ventilation: Mask ventilation without difficulty and Oral airway inserted - appropriate to patient size Laryngoscope Size: Mac and 4 Grade View: Grade III Tube type: Oral Number of attempts: 1 Airway Equipment and Method: Stylet Placement Confirmation: ETT inserted through vocal cords under direct vision, positive ETCO2 and breath sounds checked- equal and bilateral Secured at: 23 cm Tube secured with: Tape Dental Injury: Teeth and Oropharynx as per pre-operative assessment

## 2022-02-21 NOTE — Anesthesia Postprocedure Evaluation (Signed)
Anesthesia Post Note  Patient: Bohdi Leeds  Procedure(s) Performed: Laminectomy L2-3 Central and L5-S1 left, microdiscectomy L2-3     Patient location during evaluation: PACU Anesthesia Type: General Level of consciousness: sedated Pain management: pain level controlled Vital Signs Assessment: post-procedure vital signs reviewed and stable Respiratory status: spontaneous breathing and respiratory function stable Cardiovascular status: stable Postop Assessment: no apparent nausea or vomiting Anesthetic complications: no  No notable events documented.  Last Vitals:  Vitals:   02/21/22 1415 02/21/22 1430  BP: 115/79 108/71  Pulse: 78 74  Resp: 12 14  Temp:    SpO2: 94% 94%    Last Pain:  Vitals:   02/21/22 1430  TempSrc:   PainSc: Asleep                 Easter Schinke DANIEL

## 2022-02-22 ENCOUNTER — Encounter (HOSPITAL_COMMUNITY): Payer: Self-pay | Admitting: Specialist

## 2022-02-22 DIAGNOSIS — M48062 Spinal stenosis, lumbar region with neurogenic claudication: Secondary | ICD-10-CM | POA: Diagnosis not present

## 2022-02-22 LAB — BASIC METABOLIC PANEL
Anion gap: 11 (ref 5–15)
BUN: 18 mg/dL (ref 8–23)
CO2: 21 mmol/L — ABNORMAL LOW (ref 22–32)
Calcium: 9.1 mg/dL (ref 8.9–10.3)
Chloride: 106 mmol/L (ref 98–111)
Creatinine, Ser: 1.48 mg/dL — ABNORMAL HIGH (ref 0.61–1.24)
GFR, Estimated: 53 mL/min — ABNORMAL LOW (ref >60)
Glucose, Bld: 181 mg/dL — ABNORMAL HIGH (ref 70–99)
Potassium: 4.9 mmol/L (ref 3.5–5.1)
Sodium: 138 mmol/L (ref 135–145)

## 2022-02-22 MED ORDER — METHOCARBAMOL 500 MG PO TABS: 500.0000 mg | ORAL_TABLET | Freq: Four times a day (QID) | ORAL | 1 refills | Status: DC | PRN

## 2022-02-22 NOTE — Plan of Care (Signed)
  Problem: Education: Goal: Ability to verbalize activity precautions or restrictions will improve Outcome: Completed/Met Goal: Knowledge of the prescribed therapeutic regimen will improve Outcome: Completed/Met Goal: Understanding of discharge needs will improve Outcome: Completed/Met  Patient and alert and oriented, void, ambulate, surgical site clean and dry no sign of infection. D/c instructions explain and given to the patient all questions answered. Will d/c patient home per order

## 2022-02-22 NOTE — Evaluation (Signed)
Occupational Therapy Evaluation/Discharge Patient Details Name: [name] MRN: [id number] DOB: 09-13-1960 Today's Date: 02/22/2022   History of Present Illness Pt is a 61 y/o male admitted for L2-3 laminectomy and L5-S1 microdiscectomy in the setting of spinal stenosis and disc herniation.   Clinical Impression   PTA, pt lives with spouse and children, typically Independent in all daily tasks including full time work though limited by progressive back pain. Pt presents now with minor post op soreness. Educated re: spinal precautions for ADLs/IADLS with pt able to return demo ADLs and in-room mobility without assistance. Pt/spouse plan to borrow a BSC to place over toilet to increase ease of toilet transfers at home. Pt/family verbalized understanding of education and denied any concerns. No further skilled OT services needed.      Recommendations for follow up therapy are one component of a multi-disciplinary discharge planning process, led by the attending physician.  Recommendations may be updated based on patient status, additional functional criteria and insurance authorization.   Follow Up Recommendations  No OT follow up     Assistance Recommended at Discharge PRN  Patient can return home with the following Assistance with cooking/housework;Assist for transportation    Functional Status Assessment  Patient has had a recent decline in their functional status and demonstrates the ability to make significant improvements in function in a reasonable and predictable amount of time.  Equipment Recommendations  None recommended by OT    Recommendations for Other Services       Precautions / Restrictions Precautions Precautions: Back Precaution Booklet Issued: Yes (comment) Precaution Comments: no brace needed Restrictions Weight Bearing Restrictions: No      Mobility Bed Mobility Overal bed mobility: Modified Independent                  Transfers Overall  transfer level: Independent Equipment used: None                      Balance Overall balance assessment: No apparent balance deficits (not formally assessed)                                         ADL either performed or assessed with clinical judgement   ADL Overall ADL's : Modified independent                                       General ADL Comments: able to demo full dressing tasks without assist after education on precautions. Educated on spinal precautions for bed mobility, avoiding lifting, ADLs completed in standing and IADL techniques     Vision Baseline Vision/History: 0 No visual deficits Ability to See in Adequate Light: 0 Adequate Patient Visual Report: No change from baseline Vision Assessment?: No apparent visual deficits     Perception     Praxis      Pertinent Vitals/Pain Pain Assessment Pain Assessment: Faces Faces Pain Scale: Hurts a little bit Pain Location: back Pain Descriptors / Indicators: Grimacing Pain Intervention(s): Monitored during session     Hand Dominance Right   Extremity/Trunk Assessment Upper Extremity Assessment Upper Extremity Assessment: Overall WFL for tasks assessed   Lower Extremity Assessment Lower Extremity Assessment: Overall WFL for tasks assessed   Cervical / Trunk Assessment Cervical / Trunk Assessment: Normal   Communication Communication Communication:  No difficulties   Cognition Arousal/Alertness: Awake/alert Behavior During Therapy: WFL for tasks assessed/performed Overall Cognitive Status: Within Functional Limits for tasks assessed                                       General Comments       Exercises     Shoulder Instructions      Home Living Family/patient expects to be discharged to:: Private residence Living Arrangements: Spouse/significant other;Children Available Help at Discharge: Family Type of Home: Apartment Home Access:  Level entry     Home Layout: One level     Bathroom Shower/Tub: Chief Strategy Officer: Standard     Home Equipment: Other (comment) (has access to [facility] to place over toilet)          Prior Functioning/Environment Prior Level of Function : Driving;Working/employed;Independent/Modified Independent                        OT Problem List: Pain      OT Treatment/Interventions:      OT Goals(Current goals can be found in the care plan section) Acute Rehab OT Goals Patient Stated Goal: home today OT Goal Formulation: All assessment and education complete, DC therapy  OT Frequency:      Co-evaluation              AM-PAC OT "6 Clicks" Daily Activity     Outcome Measure Help from another person eating meals?: None Help from another person taking care of personal grooming?: None Help from another person toileting, which includes using toliet, bedpan, or urinal?: None Help from another person bathing (including washing, rinsing, drying)?: None Help from another person to put on and taking off regular upper body clothing?: None Help from another person to put on and taking off regular lower body clothing?: None 6 Click Score: 24   End of Session Nurse Communication: Mobility status  Activity Tolerance: Patient tolerated treatment well Patient left: in bed;with call bell/phone within reach;with family/visitor present  OT Visit Diagnosis: Pain Pain - part of body:  (back)                Time: 2595-6387 OT Time Calculation (min): 12 min Charges:  OT General Charges $OT Visit: 1 Visit OT Evaluation $OT Eval Low Complexity: 1 Low  Bradd Canary, OTR/L Acute Rehab Services Office: [phone]   Lorre Munroe 02/22/2022, 9:26 AM

## 2022-02-22 NOTE — Progress Notes (Signed)
Subjective: 1 Day Post-Op Procedure(s) (LRB): Central Lumbar Two-Three Laminectomy with Left Lumbar Five-Sacral One L5-S1 and Lumbar Two-Three Microdiscectomy (N/A) Patient reports pain as moderate.   Reports incisional pain, leg pain improved. Voiding without difficulty. No other c/o.  Objective: Vital signs in last 24 hours: Temp:  [97.5 F (36.4 C)-98.2 F (36.8 C)] 97.5 F (36.4 C) (12/15 0756) Pulse Rate:  [73-100] 73 (12/15 0756) Resp:  [12-22] 18 (12/15 0756) BP: (108-142)/(66-94) 134/76 (12/15 0756) SpO2:  [91 %-98 %] 96 % (12/15 0756) Weight:  [110.2 kg] 110.2 kg (12/14 0827)  Intake/Output from previous day: 12/14 0701 - 12/15 0700 In: 2351.5 [P.O.:720; I.V.:1631.5] Out: 135 [Urine:60; Blood:75] Intake/Output this shift: No intake/output data recorded.  No results for input(s): "HGB" in the last 72 hours. No results for input(s): "WBC", "RBC", "HCT", "PLT" in the last 72 hours. Recent Labs    02/22/22 0512  NA 138  K 4.9  CL 106  CO2 21*  BUN 18  CREATININE 1.48*  GLUCOSE 181*  CALCIUM 9.1   No results for input(s): "LABPT", "INR" in the last 72 hours.  Neurologically intact ABD soft Neurovascular intact Sensation intact distally Intact pulses distally Dorsiflexion/Plantar flexion intact Incision: dressing C/D/I No cellulitis present Compartment soft No calf pain or sign of DVT   Assessment/Plan: 1 Day Post-Op Procedure(s) (LRB): Central Lumbar Two-Three Laminectomy with Left Lumbar Five-Sacral One L5-S1 and Lumbar Two-Three Microdiscectomy (N/A) Advance diet Up with therapy D/C IV fluids D/C instructions, dressing instructions, Lspine precautions D/C home today  Dorothy Spark 02/22/2022, 8:00 AM

## 2022-02-22 NOTE — Progress Notes (Signed)
Physical Therapy Note  Patient noted to be ambulating independently in hallway this morning. Spoke with OT after their evaluation and patient did very well with assessed tasks. Spoke with patient and he has no further concerns or questions. Able to verbalize understanding of precautions and has no stairs at home. Feels symptoms greatly improved. Patient is functioning at a high level of independence and no physical therapy is indicated at this time. PT is signing-off. Please re-order if there is any significant change in status. Thank you for this referral.   Kathlyn Sacramento, PT, DPT Physical Therapist Acute Rehabilitation Services [facility]patient Rehabilitation [facility]

## 2022-04-10 ENCOUNTER — Encounter: Payer: Self-pay | Admitting: Family Medicine

## 2022-04-10 ENCOUNTER — Telehealth (INDEPENDENT_AMBULATORY_CARE_PROVIDER_SITE_OTHER): Payer: 59 | Admitting: Family Medicine

## 2022-04-10 DIAGNOSIS — R3989 Other symptoms and signs involving the genitourinary system: Secondary | ICD-10-CM

## 2022-04-10 DIAGNOSIS — R300 Dysuria: Secondary | ICD-10-CM

## 2022-04-10 LAB — URINALYSIS, ROUTINE W REFLEX MICROSCOPIC
Bilirubin, UA: NEGATIVE
Glucose, UA: NEGATIVE
Ketones, UA: NEGATIVE
Nitrite, UA: POSITIVE — AB
Specific Gravity, UA: 1.025 (ref 1.005–1.030)
Urobilinogen, Ur: 1 mg/dL (ref 0.2–1.0)
pH, UA: 6 (ref 5.0–7.5)

## 2022-04-10 LAB — MICROSCOPIC EXAMINATION: Renal Epithel, UA: NONE SEEN /hpf

## 2022-04-10 MED ORDER — SULFAMETHOXAZOLE-TRIMETHOPRIM 800-160 MG PO TABS: 1.0000 | ORAL_TABLET | Freq: Two times a day (BID) | ORAL | 0 refills | Status: AC

## 2022-04-10 NOTE — Progress Notes (Signed)
Virtual Visit via MyChart Video Note Due to COVID-19 pandemic this visit was conducted virtually. This visit type was conducted due to national recommendations for restrictions regarding the COVID-19 Pandemic (e.g. social distancing, sheltering in place) in an effort to limit this patient's exposure and mitigate transmission in our community. All issues noted in this document were discussed and addressed.  A physical exam was not performed with this format.   I connected with [name] on 04/10/2022 at 0849 by MyChart Video and verified that I am speaking with the correct person using two identifiers. [name] is currently located at home and patient is currently with them during visit. The provider, Monia Pouch, FNP is located in their office at time of visit.  I discussed the limitations, risks, security and privacy concerns of performing an evaluation and management service by virtual visit and the availability of in person appointments. I also discussed with the patient that there may be a patient responsible charge related to this service. The patient expressed understanding and agreed to proceed.  Subjective:  Patient ID: [name], male    DOB: 1960/07/03, 62 y.o.   MRN: [id number]  Chief Complaint:  Dysuria   HPI: [name] is a 62 y.o. male presenting on 04/10/2022 for Dysuria   Dysuria  This is a new problem. Episode onset: 2-3 weeks ago. The quality of the pain is described as aching and burning. The pain is mild. There has been no fever. He is Not sexually active. There is No history of pyelonephritis. Associated symptoms include frequency, hesitancy and urgency. Pertinent negatives include no chills, discharge, flank pain, hematuria, nausea, possible pregnancy, sweats or vomiting. Associated symptoms comments: Malodorous urine. He has tried increased fluids for the symptoms. The treatment provided no relief.     Relevant past medical,  surgical, family, and social history reviewed and updated as indicated.  Allergies and medications reviewed and updated.   Past Medical History:  Diagnosis Date   Blind left eye     Past Surgical History:  Procedure Laterality Date   COLONOSCOPY     EYE SURGERY     LUMBAR LAMINECTOMY/DECOMPRESSION MICRODISCECTOMY N/A 02/21/2022   Procedure: Central Lumbar Two-Three Laminectomy with Left Lumbar Five-Sacral One L5-S1 and Lumbar Two-Three Microdiscectomy;  Surgeon: Susa Day, MD;  Location: [place];  Service: Orthopedics;  Laterality: N/A;  2 hrs 3 C-Bed    Social History   Socioeconomic History   Marital status: Married    Spouse name: Not on file   Number of children: Not on file   Years of education: Not on file   Highest education level: Not on file  Occupational History   Not on file  Tobacco Use   Smoking status: Former    Packs/day: 1.00    Types: Cigarettes    Quit date: 09/29/2018    Years since quitting: 3.5   Smokeless tobacco: Never  Vaping Use   Vaping Use: Never used  Substance and Sexual Activity   Alcohol use: Not Currently    Comment: occassional-1-2 times a year per pt   Drug use: No   Sexual activity: Never    Birth control/protection: Condom  Other Topics Concern   Not on file  Social History Narrative   Not on file   Social Determinants of Health   Financial Resource Strain: Not on file  Food Insecurity: Not on file  Transportation Needs: Not on file  Physical Activity: Not on file  Stress: Not on file  Social Connections: Not on file  Intimate Partner Violence: Not on file    Outpatient Encounter Medications as of 04/10/2022  Medication Sig   sulfamethoxazole-trimethoprim (BACTRIM DS) 800-160 MG tablet Take 1 tablet by mouth 2 (two) times daily for 7 days.   docusate sodium (COLACE) 100 MG capsule Take 1 capsule (100 mg total) by mouth 2 (two) times daily as needed for mild constipation.   methocarbamol (ROBAXIN) 500 MG tablet Take 1  tablet (500 mg total) by mouth every 6 (six) hours as needed for muscle spasms.   oxyCODONE (OXY IR/ROXICODONE) 5 MG immediate release tablet Take 1 tablet (5 mg total) by mouth every 4 (four) hours as needed for severe pain.   polyethylene glycol (MIRALAX / GLYCOLAX) 17 g packet Take 17 g by mouth daily.   No facility-administered encounter medications on file as of 04/10/2022.    No Known Allergies  Review of Systems  Constitutional:  Negative for activity change, appetite change, chills, diaphoresis, fatigue, fever and unexpected weight change.  Eyes:  Negative for photophobia and visual disturbance.  Respiratory:  Negative for cough and shortness of breath.   Cardiovascular:  Negative for chest pain, palpitations and leg swelling.  Gastrointestinal:  Positive for abdominal pain (lower abdominal pain, bilateral). Negative for abdominal distention, anal bleeding, blood in stool, constipation, diarrhea, nausea, rectal pain and vomiting.  Genitourinary:  Positive for dysuria, frequency, hesitancy and urgency. Negative for decreased urine volume, difficulty urinating, enuresis, flank pain, genital sores, hematuria, penile discharge, penile pain, penile swelling, scrotal swelling and testicular pain.  Neurological:  Negative for dizziness, weakness, light-headedness and headaches.  Psychiatric/Behavioral:  Negative for confusion.   All other systems reviewed and are negative.        Observations/Objective: No vital signs or physical exam, this was a virtual health encounter.  Pt alert and oriented, answers all questions appropriately, and able to speak in full sentences.    Assessment and Plan: [name] was seen today for dysuria.  Diagnoses and all orders for this visit:  Dysuria Suspected UTI Reported symptoms consistent with UTI. No red flags concerning for acute pyelonephritis. Pt to come to office and provide urine if able. Will empirically start Bactrim as prescribed. Will  culture urine and change if warranted. Pt aware of red flags which require emergent evaluation. Increase water intake and avoid bladder irritants. Report new, worsening, or persistent symptoms.  -     Urinalysis, Routine w reflex microscopic -     Urine Culture -     sulfamethoxazole-trimethoprim (BACTRIM DS) 800-160 MG tablet; Take 1 tablet by mouth 2 (two) times daily for 7 days.     Follow Up Instructions: Return if symptoms worsen or fail to improve.    I discussed the assessment and treatment plan with the patient. The patient was provided an opportunity to ask questions and all were answered. The patient agreed with the plan and demonstrated an understanding of the instructions.   The patient was advised to call back or seek an in-person evaluation if the symptoms worsen or if the condition fails to improve as anticipated.  The above assessment and management plan was discussed with the patient. The patient verbalized understanding of and has agreed to the management plan. Patient is aware to call the clinic if they develop any new symptoms or if symptoms persist or worsen. Patient is aware when to return to the clinic for a follow-up visit. Patient educated on when it is appropriate to go to the emergency  department.    I provided 12 minutes of time during this MyChart Video encounter.   Monia Pouch, FNP-C [facility] [address] [place], [place] [address] [phone] 04/10/2022

## 2022-04-13 LAB — URINE CULTURE

## 2022-05-08 ENCOUNTER — Ambulatory Visit (HOSPITAL_COMMUNITY): Admission: RE | Admit: 2022-05-08 | Payer: 59 | Source: Ambulatory Visit

## 2023-07-02 ENCOUNTER — Ambulatory Visit (INDEPENDENT_AMBULATORY_CARE_PROVIDER_SITE_OTHER): Admitting: Family Medicine

## 2023-07-02 ENCOUNTER — Ambulatory Visit: Payer: Self-pay

## 2023-07-02 ENCOUNTER — Ambulatory Visit (INDEPENDENT_AMBULATORY_CARE_PROVIDER_SITE_OTHER)

## 2023-07-02 ENCOUNTER — Encounter: Payer: Self-pay | Admitting: Family Medicine

## 2023-07-02 VITALS — BP 120/75 | HR 75 | Temp 97.5°F | Ht 73.0 in | Wt 246.4 lb

## 2023-07-02 DIAGNOSIS — R109 Unspecified abdominal pain: Secondary | ICD-10-CM | POA: Diagnosis not present

## 2023-07-02 DIAGNOSIS — A491 Streptococcal infection, unspecified site: Secondary | ICD-10-CM

## 2023-07-02 DIAGNOSIS — M545 Low back pain, unspecified: Secondary | ICD-10-CM

## 2023-07-02 DIAGNOSIS — K5900 Constipation, unspecified: Secondary | ICD-10-CM | POA: Diagnosis not present

## 2023-07-02 LAB — URINALYSIS, ROUTINE W REFLEX MICROSCOPIC
Bilirubin, UA: NEGATIVE
Glucose, UA: NEGATIVE
Ketones, UA: NEGATIVE
Leukocytes,UA: NEGATIVE
Nitrite, UA: NEGATIVE
Protein,UA: NEGATIVE
RBC, UA: NEGATIVE
Specific Gravity, UA: 1.02 (ref 1.005–1.030)
Urobilinogen, Ur: 0.2 mg/dL (ref 0.2–1.0)
pH, UA: 5.5 (ref 5.0–7.5)

## 2023-07-02 NOTE — Progress Notes (Signed)
 Subjective:  Patient ID: [name], male    DOB: [birth year], 63 y.o.   MRN: [id number]  Patient Care Team: Galvin Jules, FNP as PCP - General (Family Medicine)   Chief Complaint:  Back Pain (Lower back pain - left sided back pain x 4-5 days . Took AZO otc)   HPI: [name] is a 63 y.o. male presenting on 07/02/2023 for Back Pain (Lower back pain - left sided back pain x 4-5 days . Took AZO otc)   History of Present Illness   [name] is a 63 year old male who presents with lower back pain.  He has been experiencing discomfort in the lower back region for the past four to five days. The pain is described as a 'nagging, catching like pain' that feels as though it is located in the kidney area. He has attempted self-treatment by drinking a cold beer and taking Azo, as suggested by his wife, and has been drinking a lot of water. Aleve provides some relief but does not completely alleviate the pain.  No changes in urine output, discoloration, discharge, burning sensation during urination, or blood in the urine. He urinates once at night, consistent with his usual pattern, and denies any pain when pressure is applied to the back. He denies any recent physical activity that could have caused the pain.  No nausea, vomiting, sweating, fever, chills, swelling in the testicles, or pressure in the scrotum or rectal area. He has no history of kidney stones and reports regular bowel movements, with the last one occurring this morning. He recalls a similar episode of pain three to four years ago, which he thought might have been related to a kidney issue.  He reports no weight loss, instead noting a slight weight gain. He has not experienced any radiation of the pain down his back.          Relevant past medical, surgical, family, and social history reviewed and updated as indicated.  Allergies and medications reviewed and updated. Data reviewed: Chart in  Epic.   Past Medical History:  Diagnosis Date   Blind left eye     Past Surgical History:  Procedure Laterality Date   COLONOSCOPY     EYE SURGERY     LUMBAR LAMINECTOMY/DECOMPRESSION MICRODISCECTOMY N/A 02/21/2022   Procedure: Central Lumbar Two-Three Laminectomy with Left Lumbar Five-Sacral One L5-S1 and Lumbar Two-Three Microdiscectomy;  Surgeon: Orvan Blanch, MD;  Location: [facility] OR;  Service: Orthopedics;  Laterality: N/A;  2 hrs 3 C-Bed    Social History   Socioeconomic History   Marital status: Married    Spouse name: Not on file   Number of children: Not on file   Years of education: Not on file   Highest education level: Not on file  Occupational History   Not on file  Tobacco Use   Smoking status: Former    Current packs/day: 0.00    Types: Cigarettes    Quit date: 09/29/2018    Years since quitting: 4.7   Smokeless tobacco: Never  Vaping Use   Vaping status: Never Used  Substance and Sexual Activity   Alcohol use: Not Currently    Comment: occassional-1-2 times a year per pt   Drug use: No   Sexual activity: Never    Birth control/protection: Condom  Other Topics Concern   Not on file  Social History Narrative   Not on file   Social Drivers of Health   Financial Resource Strain: Not on  file  Food Insecurity: Not on file  Transportation Needs: Not on file  Physical Activity: Not on file  Stress: Not on file  Social Connections: Not on file  Intimate Partner Violence: Not on file    Outpatient Encounter Medications as of 07/02/2023  Medication Sig   [DISCONTINUED] docusate sodium  (COLACE) 100 MG capsule Take 1 capsule (100 mg total) by mouth 2 (two) times daily as needed for mild constipation.   [DISCONTINUED] methocarbamol  (ROBAXIN ) 500 MG tablet Take 1 tablet (500 mg total) by mouth every 6 (six) hours as needed for muscle spasms.   [DISCONTINUED] oxyCODONE  (OXY IR/ROXICODONE ) 5 MG immediate release tablet Take 1 tablet (5 mg total) by mouth every  4 (four) hours as needed for severe pain.   [DISCONTINUED] polyethylene glycol (MIRALAX  / GLYCOLAX ) 17 g packet Take 17 g by mouth daily.   No facility-administered encounter medications on file as of 07/02/2023.    No Known Allergies  Pertinent ROS per HPI, otherwise unremarkable      Objective:  BP 120/75   Pulse 75   Temp (!) 97.5 F (36.4 C)   Ht 6\' 1"  (1.854 m)   Wt 246 lb 6.4 oz (111.8 kg)   SpO2 95%   BMI 32.51 kg/m    Wt Readings from Last 3 Encounters:  07/02/23 246 lb 6.4 oz (111.8 kg)  02/21/22 243 lb (110.2 kg)  02/15/22 246 lb 11.2 oz (111.9 kg)    Physical Exam Vitals and nursing note reviewed.  Constitutional:      General: He is not in acute distress.    Appearance: Normal appearance. He is obese. He is not ill-appearing, toxic-appearing or diaphoretic.  HENT:     Head: Normocephalic and atraumatic.     Nose: Nose normal.     Mouth/Throat:     Mouth: Mucous membranes are moist.  Eyes:     Conjunctiva/sclera: Conjunctivae normal.     Pupils: Pupils are equal, round, and reactive to light.  Cardiovascular:     Rate and Rhythm: Normal rate and regular rhythm.     Heart sounds: Normal heart sounds.  Pulmonary:     Effort: Pulmonary effort is normal.     Breath sounds: Normal breath sounds.  Abdominal:     General: Bowel sounds are normal. There is distension.     Palpations: Abdomen is soft.     Tenderness: There is no abdominal tenderness. There is no right CVA tenderness or left CVA tenderness.  Musculoskeletal:     Cervical back: Neck supple.     Right lower leg: No edema.     Left lower leg: No edema.  Skin:    General: Skin is warm and dry.     Capillary Refill: Capillary refill takes less than 2 seconds.  Neurological:     General: No focal deficit present.     Mental Status: He is alert and oriented to person, place, and time.  Psychiatric:        Mood and Affect: Mood normal.        Behavior: Behavior normal.        Thought Content:  Thought content normal.        Judgment: Judgment normal.    X-Ray: KUB: significant stool burden, no stones noted. No acute findings. Preliminary x-ray reading by Kattie Parrot, FNP-C, [facility].   Results for orders placed or performed in visit on 04/10/22  Urine Culture   Collection Time: 04/10/22 11:06 AM   Specimen: Urine  UR  Result Value Ref Range   Urine Culture, Routine Final report (A)    Organism ID, Bacteria Enterobacter cloacae (A)    Antimicrobial Susceptibility Comment   Microscopic Examination   Collection Time: 04/10/22 11:06 AM   Urine  Result Value Ref Range   WBC, UA 11-30 (A) 0 - 5 /hpf   RBC, Urine 0-2 0 - 2 /hpf   Epithelial Cells (non renal) 0-10 0 - 10 /hpf   Renal Epithel, UA None seen None seen /hpf   Bacteria, UA Many (A) None seen/Few  Urinalysis, Routine w reflex microscopic   Collection Time: 04/10/22 11:06 AM  Result Value Ref Range   Specific Gravity, UA 1.025 1.005 - 1.030   pH, UA 6.0 5.0 - 7.5   Color, UA Yellow Yellow   Appearance Ur Clear Clear   Leukocytes,UA 1+ (A) Negative   Protein,UA 1+ (A) Negative/Trace   Glucose, UA Negative Negative   Ketones, UA Negative Negative   RBC, UA Trace (A) Negative   Bilirubin, UA Negative Negative   Urobilinogen, Ur 1.0 0.2 - 1.0 mg/dL   Nitrite, UA Positive (A) Negative   Microscopic Examination See below:        Pertinent labs & imaging results that were available during my care of the patient were reviewed by me and considered in my medical decision making.  Assessment & Plan:  Javyon was seen today for back pain.  Diagnoses and all orders for this visit:  Acute left-sided low back pain without sciatica -     Urine Culture -     Urinalysis, Routine w reflex microscopic -     BMP8+EGFR -     CBC with Differential/Platelet -     DG Abd 1 View  Left flank pain -     Urine Culture -     Urinalysis, Routine w reflex microscopic -     BMP8+EGFR -     CBC with Differential/Platelet -      DG Abd 1 View  Constipation in male -     DG Abd 1 View     Assessment and Plan    Musculoskeletal pain Musculoskeletal pain in the lower back, likely due to a pulled muscle. Symptoms include nagging, catching pain without urinary changes, fever, or chills. Normal urinalysis rules out renal pathology, making musculoskeletal strain more likely. KUB X-ray showed no kidney stones but significant stool presence, indicating possible constipation. Aleve provides partial relief, suggesting an inflammatory component. - Order BMP to assess kidney function, including creatinine and GFR. - Aleve (naproxen) twice daily with food for two weeks. - Advise use of topical heat for pain relief. - Advise taking MiraLAX  daily with water to address constipation. - Instruct to report any worsening or new symptoms. - Inform that urine culture results will be communicated if treatment is needed.          Continue all other maintenance medications.  Follow up plan: Return if symptoms worsen or fail to improve.   Continue healthy lifestyle choices, including diet (rich in fruits, vegetables, and lean proteins, and low in salt and simple carbohydrates) and exercise (at least 30 minutes of moderate physical activity daily).  Educational handout given for constipation   The above assessment and management plan was discussed with the patient. The patient verbalized understanding of and has agreed to the management plan. Patient is aware to call the clinic if they develop any new symptoms or if symptoms persist or worsen. Patient is  aware when to return to the clinic for a follow-up visit. Patient educated on when it is appropriate to go to the emergency department.   Kattie Parrot, FNP-C [facility] [phone]

## 2023-07-02 NOTE — Telephone Encounter (Signed)
 Patient asking for call back from office tomorrow in regard to his X Ray results.   Copied from CRM [id number]. Topic: Clinical - Lab/Test Results >> Jul 02, 2023  5:18 PM [name] wrote: Reason for CRM: Pt is calling in for lab results but they are not ready so he is checking on his xray results.

## 2023-07-03 ENCOUNTER — Ambulatory Visit: Payer: Self-pay

## 2023-07-03 DIAGNOSIS — R7989 Other specified abnormal findings of blood chemistry: Secondary | ICD-10-CM

## 2023-07-03 LAB — BMP8+EGFR
BUN/Creatinine Ratio: 12 (ref 10–24)
BUN: 16 mg/dL (ref 8–27)
CO2: 26 mmol/L (ref 20–29)
Calcium: 10.2 mg/dL (ref 8.6–10.2)
Chloride: 103 mmol/L (ref 96–106)
Creatinine, Ser: 1.29 mg/dL — ABNORMAL HIGH (ref 0.76–1.27)
Glucose: 91 mg/dL (ref 70–99)
Potassium: 4.5 mmol/L (ref 3.5–5.2)
Sodium: 142 mmol/L (ref 134–144)
eGFR: 63 mL/min/{1.73_m2} (ref >59)

## 2023-07-03 LAB — CBC WITH DIFFERENTIAL/PLATELET
Basophils Absolute: 0 10*3/uL (ref 0.0–0.2)
Basos: 1 %
EOS (ABSOLUTE): 0.2 10*3/uL (ref 0.0–0.4)
Eos: 3 %
Hematocrit: 48 % (ref 37.5–51.0)
Hemoglobin: 15.5 g/dL (ref 13.0–17.7)
Immature Grans (Abs): 0 10*3/uL (ref 0.0–0.1)
Immature Granulocytes: 0 %
Lymphocytes Absolute: 2.5 10*3/uL (ref 0.7–3.1)
Lymphs: 44 %
MCH: 27.6 pg (ref 26.6–33.0)
MCHC: 32.3 g/dL (ref 31.5–35.7)
MCV: 85 fL (ref 79–97)
Monocytes Absolute: 0.5 10*3/uL (ref 0.1–0.9)
Monocytes: 9 %
Neutrophils Absolute: 2.4 10*3/uL (ref 1.4–7.0)
Neutrophils: 43 %
Platelets: 271 10*3/uL (ref 150–450)
RBC: 5.62 x10E6/uL (ref 4.14–5.80)
RDW: 13.7 % (ref 11.6–15.4)
WBC: 5.5 10*3/uL (ref 3.4–10.8)

## 2023-07-03 IMAGING — CT CT RENAL STONE PROTOCOL
2 of 4 series · 16 of 46 positions shown, 18 images · non-contrast
Comparison: None.

CLINICAL DATA: Right lower back pain.

EXAM:
CT ABDOMEN AND PELVIS WITHOUT CONTRAST
TECHNIQUE: Multidetector CT imaging of the abdomen and pelvis was performed
following the standard protocol without IV contrast.

[Series 2: axial st · axial · 0.95mm/px · z∈[+869,+1289]mm · 13 of 100 slices shown, 15 images]
[im 8/100  soft-tissue]
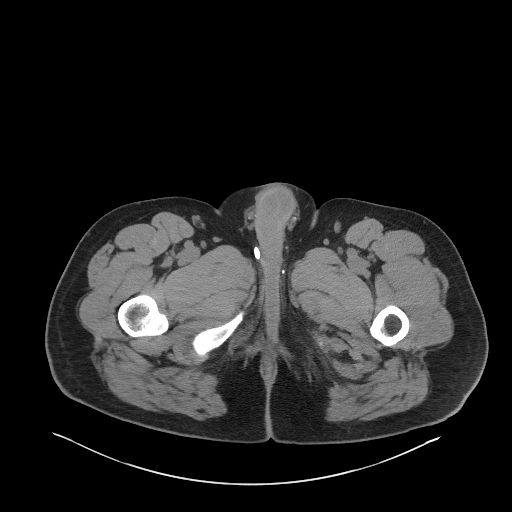
[im 8/100  bone]
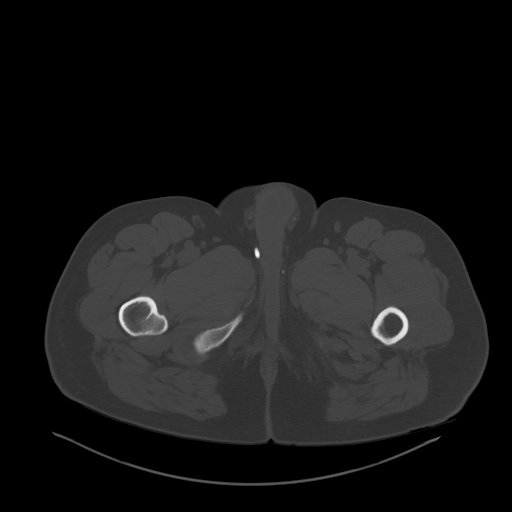
[im 15/100  soft-tissue]
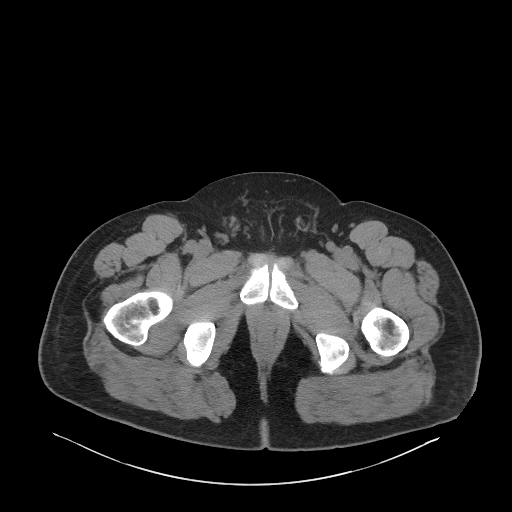
[im 22/100  soft-tissue]
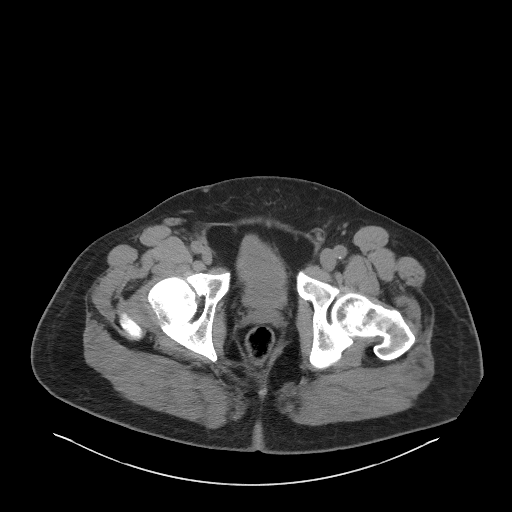
[im 29/100  soft-tissue]
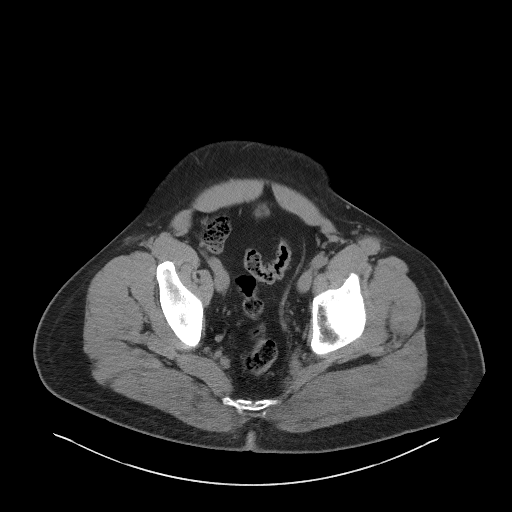
[im 36/100  soft-tissue]
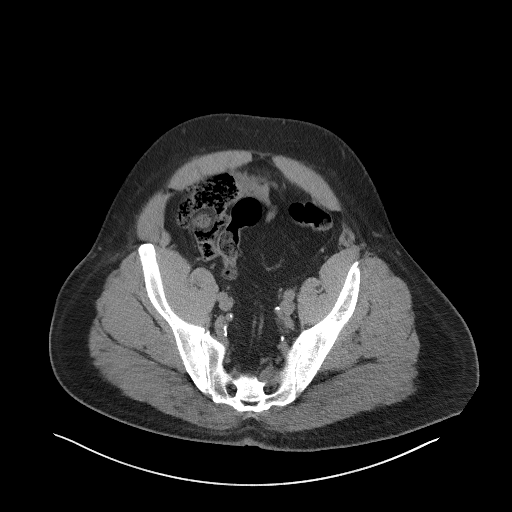
[im 43/100  soft-tissue]
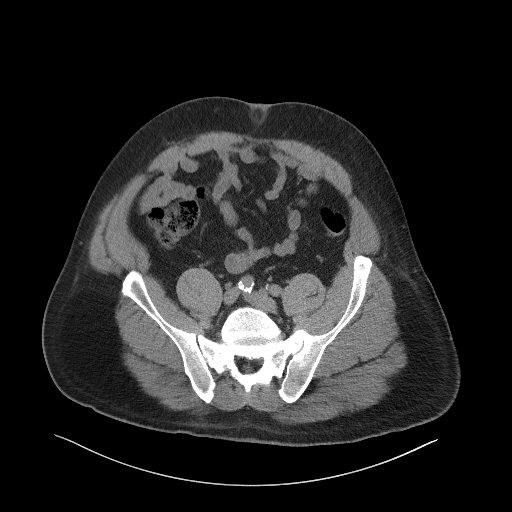
[im 50/100  soft-tissue]
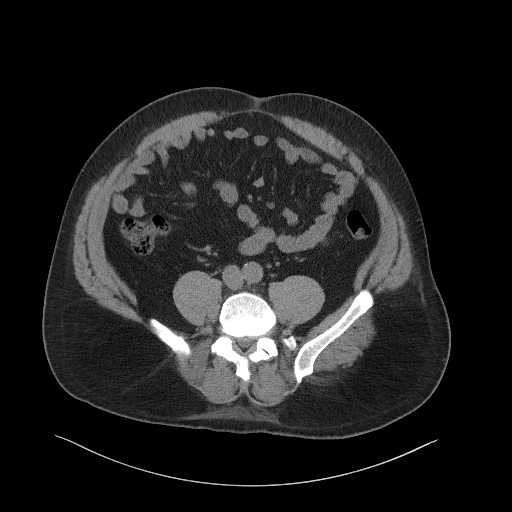
[im 57/100  soft-tissue]
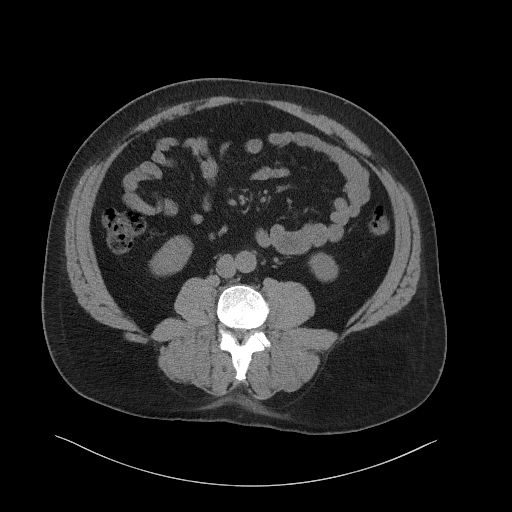
[im 64/100  soft-tissue]
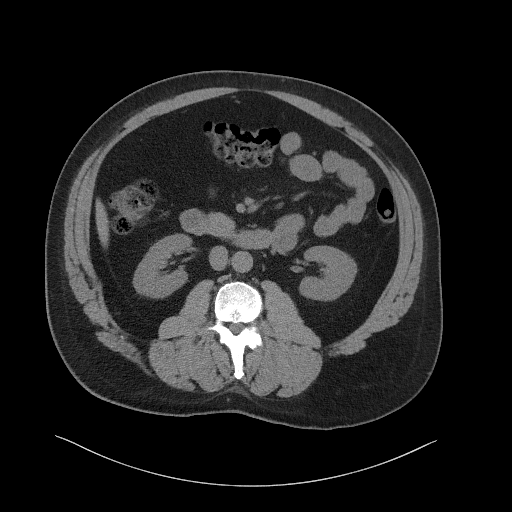
[im 64/100  bone]
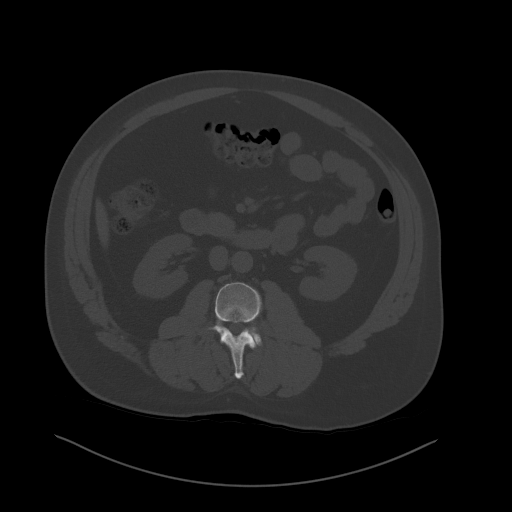
[im 71/100  soft-tissue]
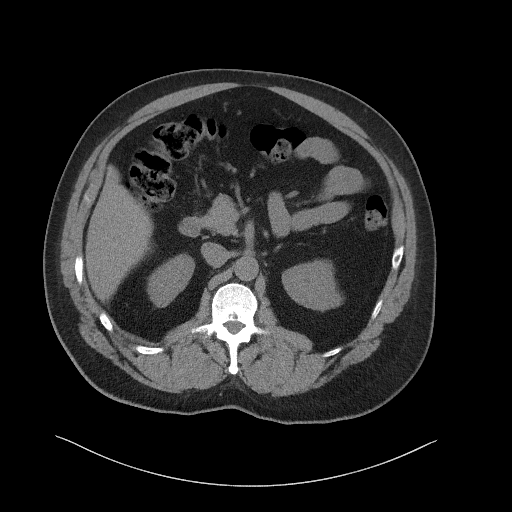
[im 78/100  soft-tissue]
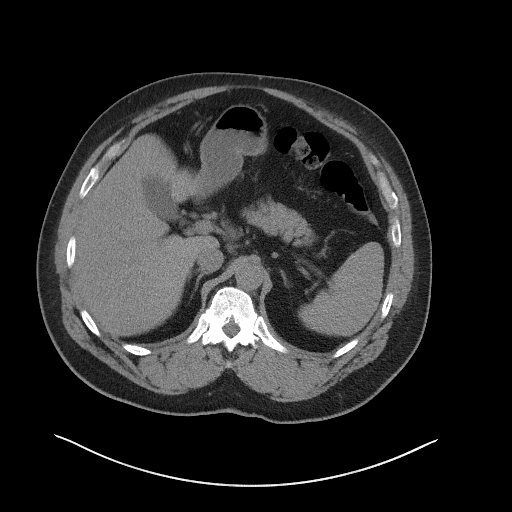
[im 85/100  soft-tissue]
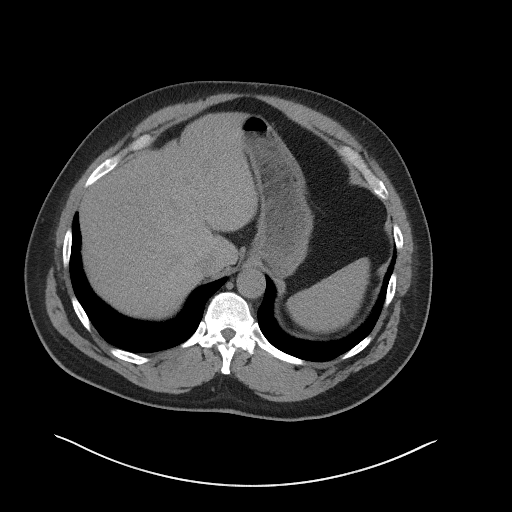
[im 92/100  soft-tissue]
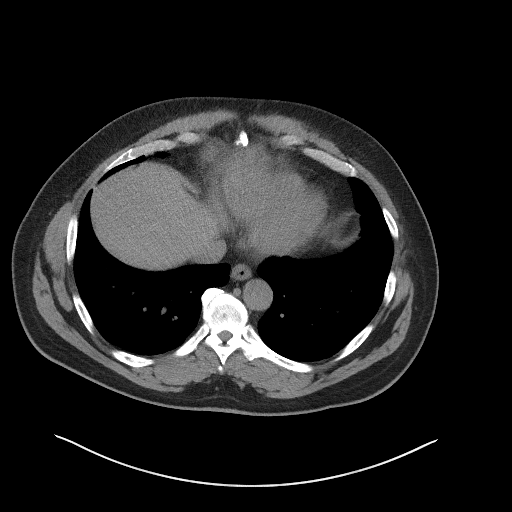

[Series 5: coronal st · coronal · 0.85mm/px · 3 of 126 slices shown]
[im 42/126  soft-tissue]
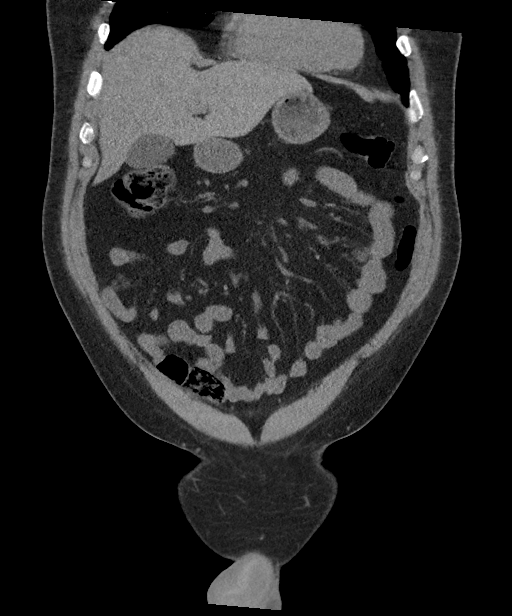
[im 56/126  soft-tissue]
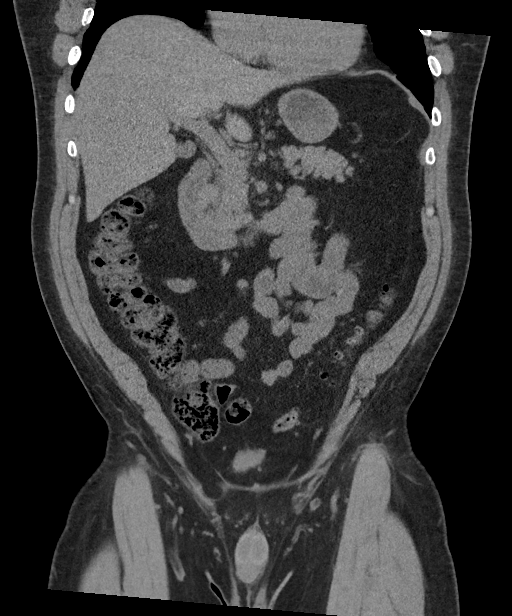
[im 70/126  soft-tissue]
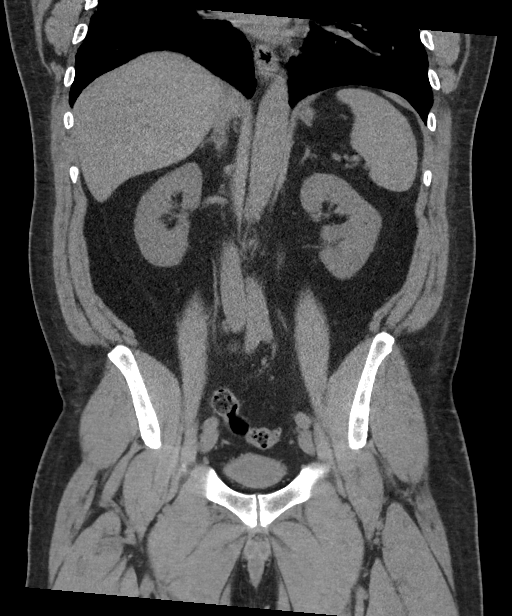

[16 of 46 positions shown; findings below may reference images not displayed]

FINDINGS: Lower chest: No acute abnormality.

Hepatobiliary: No focal liver abnormality is seen. No gallstones,
gallbladder wall thickening, or biliary dilatation.

Pancreas: Unremarkable. No pancreatic ductal dilatation or
surrounding inflammatory changes.

Spleen: Normal in size without focal abnormality.

Adrenals/Urinary Tract: Adrenal glands are unremarkable. Kidneys are
normal, without renal calculi, focal lesion, or hydronephrosis.
Urinary bladder is partially contracted and subsequently limited in
evaluation. Mild diffuse urinary bladder wall thickening is also
seen.

Stomach/Bowel: Stomach is within normal limits. Appendix appears
normal. No evidence of bowel wall thickening, distention, or
inflammatory changes. Noninflamed diverticula are seen within the
sigmoid colon.

Vascular/Lymphatic: No significant vascular findings are present. No
enlarged abdominal or pelvic lymph nodes.

Reproductive: Prostate is unremarkable.

Other: A 2.9 cm x 2.5 cm left-sided fat containing paraumbilical
hernia is seen.

No abdominopelvic ascites.

Musculoskeletal: No acute or significant osseous findings.
IMPRESSION: 1. Mild diffuse urinary bladder wall thickening, which may represent
cystitis. Correlation with urinalysis is recommended.
2. Sigmoid diverticulosis.
3. Fat-containing paraumbilical hernia.

## 2023-07-03 NOTE — Addendum Note (Signed)
 Addended by: Genene Kennel on: 07/03/2023 10:57 AM   Modules accepted: Orders

## 2023-07-03 NOTE — Telephone Encounter (Signed)
   Chief Complaint: lab  Disposition: [] ED /[] Urgent Care (no appt availability in office) / [] Appointment(In office/virtual)/ []  [facility]/ [] Home Care/ [] Refused Recommended Disposition /[] [facility]/ [x]  Follow-up with PCP Additional Notes: pt states that he question about his lab results, was questioning what creatinine was.  NT answered pt's questions and concerns and informed that he is to repeat lab in two weeks.    Copied from CRM [id number]. Topic: Clinical - Lab/Test Results >> Jul 03, 2023  8:10 AM [name] wrote: Reason for CRM: Has questions regarding their labs Reason for Disposition . [1] Other NON-URGENT information for PCP AND [2] does not require PCP response  Protocols used: PCP Call - No Triage-A-AH

## 2023-07-03 NOTE — Telephone Encounter (Signed)
 Pt aware of xray results per report and voiced understanding.

## 2023-07-03 NOTE — Telephone Encounter (Signed)
 I reviewed labs with pt and advised due to the increased Cr he can't take NSAIDS but can take tylenol  for the arthritis and pt voiced understanding. I tried to schedule pt a lab appt for repeat labs but he states he can't at the moment. He will call back next week once he knows what his schedule is. Future order placed for CMP.

## 2023-07-05 LAB — URINE CULTURE

## 2023-07-06 MED ORDER — AMOXICILLIN-POT CLAVULANATE 875-125 MG PO TABS: 1.0000 | ORAL_TABLET | Freq: Two times a day (BID) | ORAL | 0 refills | Status: AC

## 2023-07-06 NOTE — Addendum Note (Signed)
 Addended by: Galvin Jules on: 07/06/2023 09:30 AM   Modules accepted: Orders

## 2023-07-07 NOTE — Addendum Note (Signed)
 Addended by: Wynelle Heather on: 07/07/2023 09:28 AM   Modules accepted: Orders

## 2023-08-06 ENCOUNTER — Ambulatory Visit (INDEPENDENT_AMBULATORY_CARE_PROVIDER_SITE_OTHER): Admitting: Family Medicine

## 2023-08-06 ENCOUNTER — Encounter: Payer: Self-pay | Admitting: Family Medicine

## 2023-08-06 ENCOUNTER — Ambulatory Visit

## 2023-08-06 VITALS — BP 138/92 | HR 68 | Temp 97.8°F | Ht 73.0 in | Wt 250.4 lb

## 2023-08-06 DIAGNOSIS — M5441 Lumbago with sciatica, right side: Secondary | ICD-10-CM

## 2023-08-06 DIAGNOSIS — M5442 Lumbago with sciatica, left side: Secondary | ICD-10-CM | POA: Diagnosis not present

## 2023-08-06 MED ORDER — KETOROLAC TROMETHAMINE 60 MG/2ML IM SOLN
60.0000 mg | Freq: Once | INTRAMUSCULAR | Status: AC
  Administered 2023-08-06: 60 mg via INTRAMUSCULAR

## 2023-08-06 MED ORDER — DICLOFENAC SODIUM 75 MG PO TBEC: 75.0000 mg | DELAYED_RELEASE_TABLET | Freq: Two times a day (BID) | ORAL | 0 refills | Status: DC

## 2023-08-06 MED ORDER — METHYLPREDNISOLONE ACETATE 80 MG/ML IJ SUSP
80.0000 mg | Freq: Once | INTRAMUSCULAR | Status: AC
  Administered 2023-08-06: 80 mg via INTRAMUSCULAR

## 2023-08-06 NOTE — Progress Notes (Signed)
 Acute Office Visit  Subjective:     Patient ID: [name], male    DOB: October 14, 1960, 63 y.o.   MRN: [id number]  Chief Complaint  Patient presents with   Back Pain    Back Pain This is a new problem. The current episode started in the past 7 days. The problem occurs intermittently. The problem is unchanged. The pain is present in the lumbar spine. The quality of the pain is described as aching and cramping. The pain does not radiate. The pain is severe. The symptoms are aggravated by standing. Pertinent negatives include no abdominal pain, bladder incontinence, bowel incontinence, dysuria, fever, leg pain, numbness, paresis, paresthesias, pelvic pain, perianal numbness, tingling or weakness. He has tried heat, ice, muscle relaxant, analgesics and walking (muscle relaxer) for the symptoms. The treatment provided no relief.   Hx of laminectomy /microdiscectomy in 2023.[name] No recent injury. Was seen last month by PCP fr acute low back pain. KUB was done. Per report: "On limited assessment, no acute osseous findings. Mild sclerosis about the left sacroiliac joint."  Review of Systems  Constitutional:  Negative for fever.  Gastrointestinal:  Negative for abdominal pain and bowel incontinence.  Genitourinary:  Negative for bladder incontinence, dysuria and pelvic pain.  Musculoskeletal:  Positive for back pain.  Neurological:  Negative for tingling, weakness, numbness and paresthesias.        Objective:    BP (!) 138/92   Pulse 68   Temp 97.8 F (36.6 C) (Temporal)   Ht 6\' 1"  (1.854 m)   Wt 250 lb 6.4 oz (113.6 kg)   SpO2 96%   BMI 33.04 kg/m    Physical Exam Vitals and nursing note reviewed.  Constitutional:      General: He is not in acute distress.    Appearance: He is obese. He is not ill-appearing, toxic-appearing or diaphoretic.  Pulmonary:     Effort: Pulmonary effort is normal. No respiratory distress.  Musculoskeletal:     Lumbar back: Tenderness (bilateral  paraspinal) present. No swelling, edema, deformity or bony tenderness. Normal range of motion. Positive right straight leg raise test and positive left straight leg raise test.     Right lower leg: No edema.     Left lower leg: No edema.  Skin:    General: Skin is warm and dry.  Neurological:     General: No focal deficit present.     Mental Status: He is alert and oriented to person, place, and time.  Psychiatric:        Mood and Affect: Mood normal.        Behavior: Behavior normal.     No results found for any visits on 08/06/23.      Assessment & Plan:   [name] was seen today for back pain.  Diagnoses and all orders for this visit:  Acute bilateral low back pain with bilateral sciatica Reviewed OV note and KUB report from 07/02/23. No red flag symptoms. Toradol  and steroid IM injection today in office. Start voltaren  prn tomorrow. Continue tylenol , muscle relaxer prn. Discussed heat, stretching. Discussed PT referral if no improvement. Return to office for new or worsening symptoms, or if symptoms persist.  -     methylPREDNISolone  acetate (DEPO-MEDROL ) injection 80 mg -     ketorolac  (TORADOL ) injection 60 mg -     diclofenac  (VOLTAREN ) 75 MG EC tablet; Take 1 tablet (75 mg total) by mouth 2 (two) times daily. Start tomorrow.  The patient indicates understanding of  these issues and agrees with the plan.  Albertha Huger, FNP

## 2023-08-06 NOTE — Patient Instructions (Signed)
 Acute Back Pain, Adult Acute back pain is sudden and usually short-lived. It is often caused by an injury to the muscles and tissues in the back. The injury may result from: A muscle, tendon, or ligament getting overstretched or torn. Ligaments are tissues that connect bones to each other. Lifting something improperly can cause a back strain. Wear and tear (degeneration) of the spinal disks. Spinal disks are circular tissue that provide cushioning between the bones of the spine (vertebrae). Twisting motions, such as while playing sports or doing yard work. A hit to the back. Arthritis. You may have a physical exam, lab tests, and imaging tests to find the cause of your pain. Acute back pain usually goes away with rest and home care. Follow these instructions at home: Managing pain, stiffness, and swelling Take over-the-counter and prescription medicines only as told by your health care provider. Treatment may include medicines for pain and inflammation that are taken by mouth or applied to the skin, or muscle relaxants. Your health care provider may recommend applying ice during the first 24-48 hours after your pain starts. To do this: Put ice in a plastic bag. Place a towel between your skin and the bag. Leave the ice on for 20 minutes, 2-3 times a day. Remove the ice if your skin turns bright red. This is very important. If you cannot feel pain, heat, or cold, you have a greater risk of damage to the area. If directed, apply heat to the affected area as often as told by your health care provider. Use the heat source that your health care provider recommends, such as a moist heat pack or a heating pad. Place a towel between your skin and the heat source. Leave the heat on for 20-30 minutes. Remove the heat if your skin turns bright red. This is especially important if you are unable to feel pain, heat, or cold. You have a greater risk of getting burned. Activity  Do not stay in bed. Staying in  bed for more than 1-2 days can delay your recovery. Sit up and stand up straight. Avoid leaning forward when you sit or hunching over when you stand. If you work at a desk, sit close to it so you do not need to lean over. Keep your chin tucked in. Keep your neck drawn back, and keep your elbows bent at a 90-degree angle (right angle). Sit high and close to the steering wheel when you drive. Add lower back (lumbar) support to your car seat, if needed. Take short walks on even surfaces as soon as you are able. Try to increase the length of time you walk each day. Do not sit, drive, or stand in one place for more than 30 minutes at a time. Sitting or standing for long periods of time can put stress on your back. Do not drive or use heavy machinery while taking prescription pain medicine. Use proper lifting techniques. When you bend and lift, use positions that put less stress on your back: [place] your knees. Keep the load close to your body. Avoid twisting. Exercise regularly as told by your health care provider. Exercising helps your back heal faster and helps prevent back injuries by keeping muscles strong and flexible. Work with a physical therapist to make a safe exercise program, as recommended by your health care provider. Do any exercises as told by your physical therapist. Lifestyle Maintain a healthy weight. Extra weight puts stress on your back and makes it difficult to have good  posture. Avoid activities or situations that make you feel anxious or stressed. Stress and anxiety increase muscle tension and can make back pain worse. Learn ways to manage anxiety and stress, such as through exercise. General instructions Sleep on a firm mattress in a comfortable position. Try lying on your side with your knees slightly bent. If you lie on your back, put a pillow under your knees. Keep your head and neck in a straight line with your spine (neutral position) when using electronic equipment like  smartphones or pads. To do this: Raise your smartphone or pad to look at it instead of bending your head or neck to look down. Put the smartphone or pad at the level of your face while looking at the screen. Follow your treatment plan as told by your health care provider. This may include: Cognitive or behavioral therapy. Acupuncture or massage therapy. Meditation or yoga. Contact a health care provider if: You have pain that is not relieved with rest or medicine. You have increasing pain going down into your legs or buttocks. Your pain does not improve after 2 weeks. You have pain at night. You lose weight without trying. You have a fever or chills. You develop nausea or vomiting. You develop abdominal pain. Get help right away if: You develop new bowel or bladder control problems. You have unusual weakness or numbness in your arms or legs. You feel faint. These symptoms may represent a serious problem that is an emergency. Do not wait to see if the symptoms will go away. Get medical help right away. Call your local emergency services (911 in the [place] Do not drive yourself to the hospital. Summary Acute back pain is sudden and usually short-lived. Use proper lifting techniques. When you bend and lift, use positions that put less stress on your back. Take over-the-counter and prescription medicines only as told by your health care provider, and apply heat or ice as told. This information is not intended to replace advice given to you by your health care provider. Make sure you discuss any questions you have with your health care provider. Document Revised: 05/19/2020 Document Reviewed: 05/19/2020 Elsevier Patient Education  2024 [employer].

## 2023-08-07 ENCOUNTER — Other Ambulatory Visit

## 2023-08-07 DIAGNOSIS — A491 Streptococcal infection, unspecified site: Secondary | ICD-10-CM

## 2023-08-07 DIAGNOSIS — R7989 Other specified abnormal findings of blood chemistry: Secondary | ICD-10-CM

## 2023-08-08 ENCOUNTER — Ambulatory Visit: Payer: Self-pay | Admitting: Family Medicine

## 2023-08-08 LAB — CMP14+EGFR
ALT: 23 IU/L (ref 0–44)
AST: 18 IU/L (ref 0–40)
Albumin: 4.6 g/dL (ref 3.9–4.9)
Alkaline Phosphatase: 87 IU/L (ref 44–121)
BUN/Creatinine Ratio: 14 (ref 10–24)
BUN: 17 mg/dL (ref 8–27)
Bilirubin Total: 0.2 mg/dL (ref 0.0–1.2)
CO2: 19 mmol/L — ABNORMAL LOW (ref 20–29)
Calcium: 9.6 mg/dL (ref 8.6–10.2)
Chloride: 108 mmol/L — ABNORMAL HIGH (ref 96–106)
Creatinine, Ser: 1.21 mg/dL (ref 0.76–1.27)
Globulin, Total: 2.4 g/dL (ref 1.5–4.5)
Glucose: 85 mg/dL (ref 70–99)
Potassium: 4.5 mmol/L (ref 3.5–5.2)
Sodium: 142 mmol/L (ref 134–144)
Total Protein: 7 g/dL (ref 6.0–8.5)
eGFR: 68 mL/min/{1.73_m2} (ref >59)

## 2023-08-09 LAB — URINE CULTURE: Organism ID, Bacteria: NO GROWTH

## 2023-08-15 ENCOUNTER — Ambulatory Visit: Payer: Self-pay

## 2023-08-15 NOTE — Telephone Encounter (Signed)
 FYI Only or Action Required?: FYI only for provider  Patient was last seen in primary care on 08/06/2023 by Albertha Huger, FNP. Called Nurse Triage reporting Back Pain. Symptoms began a week ago. Interventions attempted: OTC medications: tylenol , ibuprofen, Prescription medications: voltaren , muscle relaxer, Rest, hydration, or home remedies, and Ice/heat application. Symptoms are: unchanged.  Triage Disposition: See PCP When Office is Open (Within 3 Days)  Patient/caregiver understands and will follow disposition?: Yes                    Copied from CRM [id number]. Topic: Clinical - Red Word Triage >> Aug 15, 2023  2:42 PM Tiffany H wrote: Reason for CRM: Patient advised that he's having persistent pain around kidney and lower buttocks. Advised that walking is painful - sitting is not painful. Patient advised its in jean pocket area. Please assist. Reason for Disposition  [1] MODERATE back pain (e.g., interferes with normal activities) AND [2] present > 3 days  Answer Assessment - Initial Assessment Questions 1. ONSET: "When did the pain begin?"      1 wk -- seen 5/28 2. LOCATION: "Where does it hurt?" (upper, mid or lower back)     Bilateral lower back/buttocks (pocket area) 3. SEVERITY: "How bad is the pain?"  (e.g., Scale 1-10; mild, moderate, or severe)   - MILD (1-3): Doesn't interfere with normal activities.    - MODERATE (4-7): Interferes with normal activities or awakens from sleep.    - SEVERE (8-10): Excruciating pain, unable to do any normal activities.      0/10 sitting, 9/10 walking (only painful w/ walking) 4. PATTERN: "Is the pain constant?" (e.g., yes, no; constant, intermittent)      With walking  5. RADIATION: "Does the pain shoot into your legs or somewhere else?"     no 6. CAUSE:  "What do you think is causing the back pain?"      Not sure  7. BACK OVERUSE:  "Any recent lifting of heavy objects, strenuous work or exercise?"     "Had back surgery  1.5 yrs ago, had to get my restrictions lifted, doctor wrote me a prescription for back pain and a muscle relaxer, I took a couple of muscle relaxers" 8. MEDICINES: "What have you taken so far for the pain?" (e.g., nothing, acetaminophen , NSAIDS)     Voltaren  prescribed 5/28, muscle relaxers (not helpful) 9. NEUROLOGIC SYMPTOMS: "Do you have any weakness, numbness, or problems with bowel/bladder control?"     Denies  10. OTHER SYMPTOMS: "Do you have any other symptoms?" (e.g., fever, abdomen pain, burning with urination, blood in urine)       Medicine (ibuprofen/Tylenol ), muscle relaxer, Voltaren , biofreeze, ice. Pt states he feels better when he leans over    Sometimes getting better, sometimes worse. Better with sitting. After taking 10 steps it's excruciating.  Denies fever, denies urinary symptoms  Protocols used: Back Pain-A-AH

## 2023-08-15 NOTE — Telephone Encounter (Signed)
 E2C2 scheduled appointment.

## 2023-08-18 ENCOUNTER — Encounter: Payer: Self-pay | Admitting: Family

## 2023-08-18 ENCOUNTER — Ambulatory Visit (INDEPENDENT_AMBULATORY_CARE_PROVIDER_SITE_OTHER): Admitting: Family

## 2023-08-18 VITALS — BP 126/81 | HR 96 | Temp 97.9°F | Ht 73.0 in | Wt 248.0 lb

## 2023-08-18 DIAGNOSIS — M5441 Lumbago with sciatica, right side: Secondary | ICD-10-CM

## 2023-08-18 DIAGNOSIS — M5442 Lumbago with sciatica, left side: Secondary | ICD-10-CM | POA: Diagnosis not present

## 2023-08-18 MED ORDER — DICLOFENAC SODIUM 75 MG PO TBEC: 75.0000 mg | DELAYED_RELEASE_TABLET | Freq: Two times a day (BID) | ORAL | 0 refills | Status: AC

## 2023-08-18 MED ORDER — PREDNISONE 10 MG (21) PO TBPK: ORAL_TABLET | ORAL | 0 refills | Status: AC

## 2023-08-18 MED ORDER — BACLOFEN 10 MG PO TABS: 10.0000 mg | ORAL_TABLET | Freq: Three times a day (TID) | ORAL | 0 refills | Status: AC

## 2023-08-18 NOTE — Patient Instructions (Signed)

## 2023-08-18 NOTE — Progress Notes (Signed)
 Subjective:    Patient ID: [name], male    DOB: 1960-12-27, 63 y.o.   MRN: [id number]  Chief Complaint  Patient presents with   Back Pain    GOES TO BUTTOCKS TINGLE IN THE BUTTOCKS    PT presents to the office today with recurrent back pain radiating down bilateral buttocks. He was seen on 08/06/23 and given Toradol  and steroid IM. Given oral diclofenac  75 mg BID and Robaxin  500 mg prn. Reports mild improvement.  Back Pain This is a new problem. The current episode started 1 to 4 weeks ago. The problem occurs intermittently. The problem has been gradually improving since onset. The pain is present in the gluteal. The quality of the pain is described as aching. The pain radiates to the left thigh and right thigh. The pain is at a severity of 8/10 (when walking). The pain is moderate. The symptoms are aggravated by standing. Associated symptoms include tingling and weakness. Pertinent negatives include no numbness. He has tried analgesics, ice, heat, bed rest and muscle relaxant for the symptoms. The treatment provided mild relief.      Review of Systems  Musculoskeletal:  Positive for back pain.  Neurological:  Positive for tingling and weakness. Negative for numbness.  All other systems reviewed and are negative.   Social History   Socioeconomic History   Marital status: Married    Spouse name: Not on file   Number of children: Not on file   Years of education: Not on file   Highest education level: Not on file  Occupational History   Not on file  Tobacco Use   Smoking status: Former    Current packs/day: 0.00    Types: Cigarettes    Quit date: 09/29/2018    Years since quitting: 4.8   Smokeless tobacco: Never  Vaping Use   Vaping status: Never Used  Substance and Sexual Activity   Alcohol use: Not Currently    Comment: occassional-1-2 times a year per pt   Drug use: No   Sexual activity: Never    Birth control/protection: Condom  Other Topics Concern    Not on file  Social History Narrative   Not on file   Social Drivers of Health   Financial Resource Strain: Not on file  Food Insecurity: Not on file  Transportation Needs: Not on file  Physical Activity: Not on file  Stress: Not on file  Social Connections: Not on file   Family History  Problem Relation Age of Onset   Diabetes Sister    Alzheimer's disease Father         Objective:   Physical Exam Vitals reviewed.  Constitutional:      General: He is not in acute distress.    Appearance: He is well-developed.  HENT:     Head: Normocephalic.     Right Ear: External ear normal.     Left Ear: External ear normal.  Eyes:     General:        Right eye: No discharge.        Left eye: No discharge.     Pupils: Pupils are equal, round, and reactive to light.  Neck:     Thyroid : No thyromegaly.  Cardiovascular:     Rate and Rhythm: Normal rate and regular rhythm.     Heart sounds: Normal heart sounds. No murmur heard. Pulmonary:     Effort: Pulmonary effort is normal. No respiratory distress.     Breath sounds: Normal  breath sounds. No wheezing.  Abdominal:     General: Bowel sounds are normal. There is no distension.     Palpations: Abdomen is soft.     Tenderness: There is no abdominal tenderness.  Musculoskeletal:        General: No tenderness. Normal range of motion.     Cervical back: Normal range of motion and neck supple.  Skin:    General: Skin is warm and dry.     Findings: No erythema or rash.  Neurological:     Mental Status: He is alert and oriented to person, place, and time.     Cranial Nerves: No cranial nerve deficit.     Deep Tendon Reflexes: Reflexes are normal and symmetric.  Psychiatric:        Behavior: Behavior normal.        Thought Content: Thought content normal.        Judgment: Judgment normal.       BP 126/81   Pulse 96   Temp 97.9 F (36.6 C) (Temporal)   Ht 6\' 1"  (1.854 m)   Wt 248 lb (112.5 kg)   SpO2 95%   BMI 32.72  kg/m      Assessment & Plan:  [name] comes in today with chief complaint of Back Pain (GOES TO BUTTOCKS TINGLE IN THE BUTTOCKS )   Diagnosis and orders addressed:  1. Acute bilateral low back pain with bilateral sciatica (Primary) Rest Ice ROM exercises encouraged- handout given Continue diclofenac  BID with food, no other NSAID's  Baclofen as needed, will d/c Robaxin   Start prednisone   Referral to PT pending Follow up if symptoms worsen or do not improve   - Ambulatory referral to Physical Therapy - predniSONE  (STERAPRED UNI-PAK 21 TAB) 10 MG (21) TBPK tablet; Use as directed  Dispense: 21 tablet; Refill: 0 - diclofenac  (VOLTAREN ) 75 MG EC tablet; Take 1 tablet (75 mg total) by mouth 2 (two) times daily. Start tomorrow.  Dispense: 30 tablet; Refill: 0 - baclofen (LIORESAL) 10 MG tablet; Take 1 tablet (10 mg total) by mouth 3 (three) times daily.  Dispense: 30 each; Refill: 0    Tommas Fragmin, FNP
